# Patient Record
Sex: Female | Born: 1965
Health system: Southern US, Community
[De-identification: ages and names within clinical notes are randomized; demographics above are authoritative.]

## PROBLEM LIST (undated history)

## (undated) DIAGNOSIS — R748 Abnormal levels of other serum enzymes: Secondary | ICD-10-CM

## (undated) DIAGNOSIS — K829 Disease of gallbladder, unspecified: Secondary | ICD-10-CM

## (undated) DIAGNOSIS — K219 Gastro-esophageal reflux disease without esophagitis: Secondary | ICD-10-CM

## (undated) DIAGNOSIS — E785 Hyperlipidemia, unspecified: Secondary | ICD-10-CM

## (undated) DIAGNOSIS — E669 Obesity, unspecified: Secondary | ICD-10-CM

## (undated) DIAGNOSIS — N289 Disorder of kidney and ureter, unspecified: Secondary | ICD-10-CM

## (undated) DIAGNOSIS — K5900 Constipation, unspecified: Secondary | ICD-10-CM

## (undated) DIAGNOSIS — R079 Chest pain, unspecified: Secondary | ICD-10-CM

## (undated) DIAGNOSIS — Z6834 Body mass index (BMI) 34.0-34.9, adult: Secondary | ICD-10-CM

## (undated) DIAGNOSIS — I10 Essential (primary) hypertension: Secondary | ICD-10-CM

## (undated) DIAGNOSIS — R7303 Prediabetes: Secondary | ICD-10-CM

## (undated) DIAGNOSIS — N762 Acute vulvitis: Secondary | ICD-10-CM

## (undated) DIAGNOSIS — T7840XA Allergy, unspecified, initial encounter: Secondary | ICD-10-CM

## (undated) HISTORY — DX: Disorder of kidney and ureter, unspecified: N28.9

## (undated) HISTORY — DX: Obesity, unspecified: E66.9

## (undated) HISTORY — DX: Acute vulvitis: N76.2

## (undated) HISTORY — DX: Abnormal levels of other serum enzymes: R74.8

## (undated) HISTORY — DX: Gastro-esophageal reflux disease without esophagitis: K21.9

## (undated) HISTORY — DX: Hyperlipidemia, unspecified: E78.5

## (undated) HISTORY — DX: Essential (primary) hypertension: I10

## (undated) HISTORY — DX: Chest pain, unspecified: R07.9

## (undated) HISTORY — DX: Body mass index (bmi) 34.0-34.9, adult: Z68.34

## (undated) HISTORY — DX: Constipation, unspecified: K59.00

## (undated) HISTORY — DX: Allergy, unspecified, initial encounter: T78.40XA

## (undated) HISTORY — DX: Prediabetes: R73.03

## (undated) HISTORY — DX: Disease of gallbladder, unspecified: K82.9

---

## 2003-02-26 ENCOUNTER — Encounter: Admission: RE | Admit: 2003-02-26 | Discharge: 2003-02-26 | Payer: Self-pay | Admitting: Family Medicine

## 2011-03-02 ENCOUNTER — Ambulatory Visit: Payer: Self-pay | Admitting: Gastroenterology

## 2012-07-06 ENCOUNTER — Emergency Department (HOSPITAL_COMMUNITY)
Admission: EM | Admit: 2012-07-06 | Discharge: 2012-07-06 | Disposition: A | Discharge status: Home / Self Care | Payer: 59 | Attending: Emergency Medicine | Admitting: Emergency Medicine

## 2012-07-06 ENCOUNTER — Ambulatory Visit (INDEPENDENT_AMBULATORY_CARE_PROVIDER_SITE_OTHER): Payer: Self-pay | Admitting: Emergency Medicine

## 2012-07-06 ENCOUNTER — Emergency Department (HOSPITAL_COMMUNITY): Payer: 59

## 2012-07-06 ENCOUNTER — Encounter (HOSPITAL_COMMUNITY): Payer: Self-pay | Admitting: Emergency Medicine

## 2012-07-06 VITALS — BP 144/86 | HR 81 | Temp 98.7°F | Resp 17 | Ht 60.0 in | Wt 150.0 lb

## 2012-07-06 DIAGNOSIS — R0602 Shortness of breath: Secondary | ICD-10-CM | POA: Insufficient documentation

## 2012-07-06 DIAGNOSIS — R748 Abnormal levels of other serum enzymes: Secondary | ICD-10-CM | POA: Insufficient documentation

## 2012-07-06 DIAGNOSIS — R079 Chest pain, unspecified: Secondary | ICD-10-CM

## 2012-07-06 DIAGNOSIS — R791 Abnormal coagulation profile: Secondary | ICD-10-CM | POA: Insufficient documentation

## 2012-07-06 DIAGNOSIS — I200 Unstable angina: Secondary | ICD-10-CM

## 2012-07-06 DIAGNOSIS — R7989 Other specified abnormal findings of blood chemistry: Secondary | ICD-10-CM

## 2012-07-06 LAB — URINALYSIS, ROUTINE W REFLEX MICROSCOPIC
Bilirubin Urine: NEGATIVE
Ketones, ur: NEGATIVE mg/dL
Leukocytes, UA: NEGATIVE
Nitrite: NEGATIVE
Protein, ur: NEGATIVE mg/dL

## 2012-07-06 LAB — CBC WITH DIFFERENTIAL/PLATELET
Eosinophils Absolute: 0 10*3/uL (ref 0.0–0.7)
Hemoglobin: 11.6 g/dL — ABNORMAL LOW (ref 12.0–15.0)
Lymphocytes Relative: 23 % (ref 12–46)
Lymphs Abs: 1.8 10*3/uL (ref 0.7–4.0)
MCH: 26.8 pg (ref 26.0–34.0)
Monocytes Relative: 11 % (ref 3–12)
Neutro Abs: 5.1 10*3/uL (ref 1.7–7.7)
Neutrophils Relative %: 66 % (ref 43–77)
RBC: 4.33 MIL/uL (ref 3.87–5.11)

## 2012-07-06 LAB — COMPREHENSIVE METABOLIC PANEL
Alkaline Phosphatase: 81 U/L (ref 39–117)
BUN: 9 mg/dL (ref 6–23)
CO2: 24 mEq/L (ref 19–32)
Chloride: 103 mEq/L (ref 96–112)
GFR calc Af Amer: >90 mL/min (ref >90)
GFR calc non Af Amer: >90 mL/min (ref >90)
Glucose, Bld: 86 mg/dL (ref 70–99)
Potassium: 3.8 mEq/L (ref 3.5–5.1)
Total Bilirubin: 0.7 mg/dL (ref 0.3–1.2)

## 2012-07-06 LAB — URINE MICROSCOPIC-ADD ON

## 2012-07-06 LAB — POCT I-STAT TROPONIN I
Troponin i, poc: 0 ng/mL (ref 0.00–0.08)
Troponin i, poc: 0.07 ng/mL (ref 0.00–0.08)

## 2012-07-06 MED ORDER — IOHEXOL 300 MG/ML  SOLN: 50.0000 mL | Freq: Once | INTRAMUSCULAR | Status: DC | PRN

## 2012-07-06 MED ORDER — SODIUM CHLORIDE 0.9 % IV SOLN: 20.0000 mL | INTRAVENOUS | Status: DC

## 2012-07-06 MED ORDER — NITROGLYCERIN 0.3 MG SL SUBL
0.4000 mg | SUBLINGUAL_TABLET | Freq: Once | SUBLINGUAL | Status: AC
  Administered 2012-07-06: 0.3 mg via SUBLINGUAL

## 2012-07-06 MED ORDER — IOHEXOL 350 MG/ML SOLN
100.0000 mL | Freq: Once | INTRAVENOUS | Status: AC | PRN
  Administered 2012-07-06: 100 mL via INTRAVENOUS

## 2012-07-06 NOTE — ED Notes (Signed)
Pt verbalized understanding of importance to follow up with gastro.

## 2012-07-06 NOTE — ED Notes (Signed)
Pt states that she was woken up during the night with chest tightness and some sob, then pain progressed to an "intense pain " lower epigastric region. Pain did not go away , went to Urgent care and was given asprin 325mg  and Nitro x1   Pt was initially rating pain as 10 then after the Nitro about 10 minutes pain went completely away.

## 2012-07-06 NOTE — ED Notes (Signed)
Patient transported to CT 

## 2012-07-06 NOTE — ED Notes (Signed)
Pt.dress in a gown On monitor.

## 2012-07-06 NOTE — Progress Notes (Signed)
[facility] [address], [place] [address] [phone]  Date:  07/06/2012   Name:  Kristy Cook   DOB:  1965-10-08   MRN:  [id number]  PCP:  No primary provider on file.    Chief Complaint: Chest Pain   History of Present Illness:  Kristy Cook is a 47 y.o. very pleasant female patient who presents with the following:  Patient describes a history of intermittent and increasingly more frequent episodes of chest pain that have lasted from an hour previously to today's pain that began around 0300 and was present at 1030.  She denies radiation, has some shortness of breath but no diaphoresis, nausea or vomiting. No other associated complaint.  No sensation of rapid or irregular heart beat.  Non smoker.  No history of hypertension or high cholesterol or diabetes.  No family history of accelerated cardiovascular disease.  Somewhat overweight and sedentary.2  There is no problem list on file for this patient.   No past medical history on file.  No past surgical history on file.  History  Substance Use Topics  . Smoking status: Not on file  . Smokeless tobacco: Not on file  . Alcohol Use: Not on file    No family history on file.  Not on File  Medication list has been reviewed and updated.  No current outpatient prescriptions on file prior to visit.    Review of Systems:  As per HPI, otherwise negative.    Physical Examination: Filed Vitals:   07/06/12 1029  BP: 128/82  Pulse: 95  Temp: 98.7 F (37.1 C)  Resp: 17   Filed Vitals:   07/06/12 1029  Height: 5' (1.524 m)  Weight: 150 lb (68.04 kg)   Body mass index is 29.30 kg/(m^2). Ideal Body Weight: Weight in (lb) to have BMI = 25: 127.7   GEN: WDWN, NAD, Non-toxic, A & O x 3 HEENT: Atraumatic, Normocephalic. Neck supple. No masses, No LAD. Ears and Nose: No external deformity. CV: RRR, No M/G/R. No JVD. No thrill. No extra heart sounds. PULM: CTA B, no wheezes, crackles,  rhonchi. No retractions. No resp. distress. No accessory muscle use. ABD: S, NT, ND, +BS. No rebound. No HSM. EXTR: No c/c/e NEURO Normal gait.  PSYCH: Normally interactive. Conversant. Not depressed or anxious appearing.  Calm demeanor.    Assessment and Plan: Chest pain IV O2 ASA NTG Refer to hospital  ekg significant for possible anterior infarct in past. Remained hemodynamically stable in the office until transfer  Carmelina Dane, MD

## 2012-07-06 NOTE — ED Provider Notes (Signed)
History     CSN: [id number]  Arrival date & time 07/06/12  1148   First MD Initiated Contact with Patient 07/06/12 1214      Chief Complaint  Patient presents with  . Chest Pain    (Consider location/radiation/quality/duration/timing/severity/associated sxs/prior treatment) The history is provided by the patient and medical records.    Niyla Marone is a 47 y.o. female  with no known medical Hx presents to the Emergency Department complaining of acute, persistent, progressively worsening chest pain which woke her from sleep onset 2am.  Pain is described as sharp pain without heaviness, rated at 10/10 for several hours and the lessened close to 7am. Pain was located substernal and did not radiate.  Pt states she was unable to lay down during this time.  She thought is was something that she ate therefore she self-induce vomiting several times without relief.  Pt states laying down increased the pain and made her SOB.  Pt then presented to an [facility] who gave her ASA and nitro. Pt states it took some time for those medications to help, but it did resolved. Associated symptoms include shortness of breath.  Nothing makes it better and laying down makes it worse.  Pt denies fever, chills, headache, abdominal pain, nausea, diarrhea, weakness, dizziness, lightheadedness.   Pt states that palpation also made it worse.  Similar episodes on 2013 and 2012 (total of 3) with spontaneous resolution within an hr or so.  Pain felt the same as previous times, but did not resolve spontaneously.  No personal cardiac Hx. Maternal Grandmother age 8 died of MI and grandfather with MI.     History reviewed. No pertinent past medical history.  Past Surgical History  Procedure Date  . Cesarean section     History reviewed. No pertinent family history.  History  Substance Use Topics  . Smoking status: Never Smoker   . Smokeless tobacco: Not on file  . Alcohol Use: No    OB History    Grav Para Term Preterm  Abortions TAB SAB Ect Mult Living                  Review of Systems  Constitutional: Negative for fever, diaphoresis, appetite change, fatigue and unexpected weight change.  HENT: Negative for mouth sores and neck stiffness.   Eyes: Negative for visual disturbance.  Respiratory: Positive for shortness of breath. Negative for cough, chest tightness and wheezing.   Cardiovascular: Positive for chest pain.  Gastrointestinal: Negative for nausea, vomiting, abdominal pain, diarrhea and constipation.  Genitourinary: Negative for dysuria, urgency, frequency and hematuria.  Skin: Negative for rash.  Neurological: Negative for syncope, light-headedness and headaches.  Psychiatric/Behavioral: Negative for sleep disturbance. The patient is not nervous/anxious.   All other systems reviewed and are negative.    Allergies  Review of patient's allergies indicates no known allergies.  Home Medications  No current outpatient prescriptions on file.  BP 148/85  Pulse 102  Temp 98.2 F (36.8 C) (Oral)  Resp 16  SpO2 100%  LMP 06/25/2012  Physical Exam  Nursing note and vitals reviewed. Constitutional: She is oriented to person, place, and time. She appears well-developed and well-nourished. No distress.  HENT:  Head: Normocephalic and atraumatic.  Mouth/Throat: Oropharynx is clear and moist. No oropharyngeal exudate.  Eyes: Conjunctivae normal are normal. Pupils are equal, round, and reactive to light. No scleral icterus.  Neck: Normal range of motion. Neck supple.  Cardiovascular: Normal rate, regular rhythm, S1 normal, normal heart sounds  and intact distal pulses.  Exam reveals no gallop and no friction rub.   No murmur heard. Pulses:      Radial pulses are 2+ on the right side, and 2+ on the left side.       Dorsalis pedis pulses are 2+ on the right side, and 2+ on the left side.       Posterior tibial pulses are 2+ on the right side, and 2+ on the left side.  Pulmonary/Chest: Effort  normal and breath sounds normal. No respiratory distress. She has no decreased breath sounds. She has no wheezes. She has no rhonchi. She has no rales. She exhibits no tenderness.  Abdominal: Soft. Normal appearance and bowel sounds are normal. She exhibits no distension and no mass. There is no tenderness. There is no rigidity, no rebound, no guarding, no CVA tenderness, no tenderness at McBurney's point and negative Murphy's sign.  Musculoskeletal: Normal range of motion. She exhibits no edema and no tenderness.       No calf tenderness, no unilateral leg swelling, no peripheral edema  Lymphadenopathy:    She has no cervical adenopathy.  Neurological: She is alert and oriented to person, place, and time. She exhibits normal muscle tone. Coordination normal.       Speech is clear and goal oriented Moves extremities without ataxia  Skin: Skin is warm and dry. No rash noted. She is not diaphoretic. No erythema.  Psychiatric: She has a normal mood and affect.    ED Course  Procedures (including critical care time)  Labs Reviewed  CBC WITH DIFFERENTIAL - Abnormal; Notable for the following:    Hemoglobin 11.6 (*)     HCT 34.7 (*)     All other components within normal limits  COMPREHENSIVE METABOLIC PANEL - Abnormal; Notable for the following:    Albumin 3.1 (*)     AST 1447 (*)     ALT 1257 (*)     All other components within normal limits  URINALYSIS, ROUTINE W REFLEX MICROSCOPIC - Abnormal; Notable for the following:    Color, Urine AMBER (*)  BIOCHEMICALS MAY BE AFFECTED BY COLOR   Hgb urine dipstick SMALL (*)     All other components within normal limits  D-DIMER, QUANTITATIVE - Abnormal; Notable for the following:    D-Dimer, Quant 1.42 (*)     All other components within normal limits  URINE MICROSCOPIC-ADD ON - Abnormal; Notable for the following:    Squamous Epithelial / LPF FEW (*)     Bacteria, UA FEW (*)     All other components within normal limits  PREGNANCY, URINE    POCT I-STAT TROPONIN I  LIPASE, BLOOD  POCT I-STAT TROPONIN I  CBC   Dg Chest 2 View  07/06/2012  *RADIOLOGY REPORT*  Clinical Data: Chest pain, shortness of breath  CHEST - 2 VIEW  Comparison: None.  Findings: Normal heart size and vascularity.  Minimal streaky opacity in the left lower lobe retrocardiac region, suspect atelectasis.  No definite edema pattern, CHF, focal pneumonia, collapse, consolidation, effusion or pneumothorax.  Trachea is midline. degenerative changes of the spine.  IMPRESSION: Streaky left base opacity, suspect atelectasis.   Original Report Authenticated By: Judie Petit. Miles Costain, M.D.     Results for orders placed during the hospital encounter of 07/06/12  CBC WITH DIFFERENTIAL      Component Value Range   WBC 7.8  4.0 - 10.5 K/uL   RBC 4.33  3.87 - 5.11 MIL/uL   Hemoglobin  11.6 (*) 12.0 - 15.0 g/dL   HCT 40.9 (*) 81.1 - 91.4 %   MCV 80.1  78.0 - 100.0 fL   MCH 26.8  26.0 - 34.0 pg   MCHC 33.4  30.0 - 36.0 g/dL   RDW 78.2  95.6 - 21.3 %   Platelets 252  150 - 400 K/uL   Neutrophils Relative 66  43 - 77 %   Neutro Abs 5.1  1.7 - 7.7 K/uL   Lymphocytes Relative 23  12 - 46 %   Lymphs Abs 1.8  0.7 - 4.0 K/uL   Monocytes Relative 11  3 - 12 %   Monocytes Absolute 0.9  0.1 - 1.0 K/uL   Eosinophils Relative 0  0 - 5 %   Eosinophils Absolute 0.0  0.0 - 0.7 K/uL   Basophils Relative 0  0 - 1 %   Basophils Absolute 0.0  0.0 - 0.1 K/uL  COMPREHENSIVE METABOLIC PANEL      Component Value Range   Sodium 137  135 - 145 mEq/L   Potassium 3.8  3.5 - 5.1 mEq/L   Chloride 103  96 - 112 mEq/L   CO2 24  19 - 32 mEq/L   Glucose, Bld 86  70 - 99 mg/dL   BUN 9  6 - 23 mg/dL   Creatinine, Ser 0.86  0.50 - 1.10 mg/dL   Calcium 8.7  8.4 - 57.8 mg/dL   Total Protein 6.7  6.0 - 8.3 g/dL   Albumin 3.1 (*) 3.5 - 5.2 g/dL   AST 4696 (*) 0 - 37 U/L   ALT 1257 (*) 0 - 35 U/L   Alkaline Phosphatase 81  39 - 117 U/L   Total Bilirubin 0.7  0.3 - 1.2 mg/dL   GFR calc non Af Amer >90  >90  mL/min   GFR calc Af Amer >90  >90 mL/min  URINALYSIS, ROUTINE W REFLEX MICROSCOPIC      Component Value Range   Color, Urine AMBER (*) YELLOW   APPearance CLEAR  CLEAR   Specific Gravity, Urine 1.022  1.005 - 1.030   pH 5.5  5.0 - 8.0   Glucose, UA NEGATIVE  NEGATIVE mg/dL   Hgb urine dipstick SMALL (*) NEGATIVE   Bilirubin Urine NEGATIVE  NEGATIVE   Ketones, ur NEGATIVE  NEGATIVE mg/dL   Protein, ur NEGATIVE  NEGATIVE mg/dL   Urobilinogen, UA 1.0  0.0 - 1.0 mg/dL   Nitrite NEGATIVE  NEGATIVE   Leukocytes, UA NEGATIVE  NEGATIVE  PREGNANCY, URINE      Component Value Range   Preg Test, Ur NEGATIVE  NEGATIVE  D-DIMER, QUANTITATIVE      Component Value Range   D-Dimer, Quant 1.42 (*) 0.00 - 0.48 ug/mL-FEU  URINE MICROSCOPIC-ADD ON      Component Value Range   Squamous Epithelial / LPF FEW (*) RARE   WBC, UA 0-2  <3 WBC/hpf   RBC / HPF 0-2  <3 RBC/hpf   Bacteria, UA FEW (*) RARE  POCT I-STAT TROPONIN I      Component Value Range   Troponin i, poc 0.00  0.00 - 0.08 ng/mL   Comment 3           LIPASE, BLOOD      Component Value Range   Lipase 26  11 - 59 U/L  POCT I-STAT TROPONIN I      Component Value Range   Troponin i, poc 0.07  0.00 - 0.08 ng/mL  Comment 3            Dg Chest 2 View  07/06/2012  *RADIOLOGY REPORT*  Clinical Data: Chest pain, shortness of breath  CHEST - 2 VIEW  Comparison: None.  Findings: Normal heart size and vascularity.  Minimal streaky opacity in the left lower lobe retrocardiac region, suspect atelectasis.  No definite edema pattern, CHF, focal pneumonia, collapse, consolidation, effusion or pneumothorax.  Trachea is midline. degenerative changes of the spine.  IMPRESSION: Streaky left base opacity, suspect atelectasis.   Original Report Authenticated By: Judie Petit. Miles Costain, M.D.     ECG;  Date: 07/06/2012  Rate: 81  Rhythm: normal sinus rhythm  QRS Axis: normal  Intervals: normal  ST/T Wave abnormalities: normal  Conduction Disutrbances:none   Narrative Interpretation: nonischemic ECG  Old EKG Reviewed: none available    1. Chest pain   2. Elevated liver enzymes   3. Elevated d-dimer       MDM  Alinda Sierras with chest pain.  Concern for ACS vs PE vs epigastric etiologies.  Patient is PERC negative and a TIMI score 0. Patient without exogenous estrogen, recent surgery, long travel or immobilization.  She's not tachycardic currently short of breath. She is pain free on my assessment, NAD, alert and oriented, nonseptic, nontoxic appearing.    Patient with an unremarkable CBC, urinalysis without evidence of urinary tract infection, d-dimer positive at 1.42 the. CMP with elevated liver enzymes with AST at 1447 and ALT at 1257.  Initial troponin of 0.00.  Patient without history of hepatitis, pancreatitis or gallstones.    Discussed the patient with Dr. Clarene Duke; we'll proceed with CT angiography and will skin daily at the same time to look for pancreatitis, choledocholithiasis, cholangitis.    Pt asking to leave AMA.  I explained our concerns, the risk of leaving, and insurance implications.  She states understanding and consents to the CT angio.  Pt with repeat troponin at 0.07, elevated from the initial of 0.00.    Dr. Samuel Jester was consulted, evaluated this patient with me and agrees with the plan.    I have discussed the patient and the plan with Santiago Glad PA-C and she will assume care including follow-up of the Ct abd and Ct angio.        Dahlia Client Rogue Pautler, PA-C 07/06/12 1648

## 2012-07-06 NOTE — ED Notes (Signed)
CT notified pt finished drinking contrast  

## 2012-07-07 ENCOUNTER — Telehealth: Payer: Self-pay

## 2012-07-07 NOTE — Telephone Encounter (Signed)
Records printed and in drawer to be picked up. LMOM.

## 2012-07-07 NOTE — Telephone Encounter (Signed)
PT STATES SHE WOULD LIKE TO COME BY AND P/U A COPY OF HER OV NOTES. PLEASE CALL [phone] WHEN READY FOR HER TO PICK UP

## 2012-07-07 NOTE — ED Provider Notes (Signed)
Medical screening examination/treatment/procedure(s) were performed by non-physician practitioner and as supervising physician I was immediately available for consultation/collaboration.   Abdulkareem Badolato M Kerby Borner, DO 07/07/12 1238 

## 2012-07-07 NOTE — Progress Notes (Signed)
Reviewed and agree.

## 2012-07-09 ENCOUNTER — Ambulatory Visit
Admission: RE | Admit: 2012-07-09 | Discharge: 2012-07-09 | Disposition: A | Discharge status: Home / Self Care | Payer: 59 | Source: Ambulatory Visit | Attending: Gastroenterology | Admitting: Gastroenterology

## 2012-07-09 ENCOUNTER — Other Ambulatory Visit: Payer: Self-pay | Admitting: Gastroenterology

## 2012-07-09 DIAGNOSIS — R7401 Elevation of levels of liver transaminase levels: Secondary | ICD-10-CM

## 2012-07-30 ENCOUNTER — Other Ambulatory Visit: Payer: Self-pay | Admitting: Obstetrics and Gynecology

## 2012-10-16 ENCOUNTER — Encounter: Payer: Self-pay | Admitting: Emergency Medicine

## 2013-08-04 ENCOUNTER — Other Ambulatory Visit: Payer: Self-pay | Admitting: Obstetrics and Gynecology

## 2014-06-14 ENCOUNTER — Ambulatory Visit (INDEPENDENT_AMBULATORY_CARE_PROVIDER_SITE_OTHER): Payer: 59 | Admitting: Family Medicine

## 2014-06-14 VITALS — BP 122/82 | HR 83 | Temp 98.3°F | Resp 20 | Ht 60.0 in | Wt 172.8 lb

## 2014-06-14 DIAGNOSIS — R05 Cough: Secondary | ICD-10-CM

## 2014-06-14 DIAGNOSIS — R0602 Shortness of breath: Secondary | ICD-10-CM

## 2014-06-14 MED ORDER — ALBUTEROL SULFATE (2.5 MG/3ML) 0.083% IN NEBU: 2.5000 mg | INHALATION_SOLUTION | Freq: Once | RESPIRATORY_TRACT | Status: DC

## 2014-06-14 NOTE — Progress Notes (Signed)
[facility] [address] [place] [address] [phone]  Date:  06/14/2014   Name:  Kristy Cook   DOB:  [birth year]   MRN:  [id number]  PCP:  No primary care provider on file.    Chief Complaint: Cough; Shortness of Breath; and Chest Pain   History of Present Illness:  Kristy Cook is a 48 y.o. very pleasant female patient who presents with the following:  She is here today with SOB- she noted it first around thanksgiving with cough, SOB, painful cough.  Lasted about 2 weeks, went away, but sx returned yesterday.   She is coughing again and also feels SOB She has not noted a fever, but she is couging up some mucus.  No ST or earache No GI symptoms.   She denies CP, but does have SOB.  It was "really bad" last night, not as bad today She is not aware of any lung problems.  No cardiovascular problems  She was in the ED last year for chest pain but this turned out to be gallstones.  She has not had surgery and has not had any more issues with this.   No recent travel or surgery, no history of DVT, on depo, no calf pain or swelling  She has a history of allegies but no asthma Never a smoker.   Patient Active Problem List   Diagnosis Date Noted  . Unstable angina 07/06/2012    Past Medical History  Diagnosis Date  . Allergy     Past Surgical History  Procedure Laterality Date  . Cesarean section      History  Substance Use Topics  . Smoking status: Never Smoker   . Smokeless tobacco: Not on file  . Alcohol Use: No    History reviewed. No pertinent family history.  No Known Allergies  Medication list has been reviewed and updated.  No current outpatient prescriptions on file prior to visit.   No current facility-administered medications on file prior to visit.    Review of Systems:  As per HPI- otherwise negative.   Physical Examination: Filed Vitals:   06/14/14 1017  BP: 122/82  Pulse: 83  Temp: 98.3 F (36.8 C)   Resp: 20   Filed Vitals:   06/14/14 1017  Height: 5' (1.524 m)  Weight: 172 lb 12.8 oz (78.382 kg)   Body mass index is 33.75 kg/(m^2). Ideal Body Weight: Weight in (lb) to have BMI = 25: 127.7  GEN: WDWN, NAD, Non-toxic, A & O x 3, obese, looks well HEENT: Atraumatic, Normocephalic. Neck supple. No masses, No LAD.  Bilateral TM wnl, oropharynx normal.  PEERL,EOMI.   Ears and Nose: No external deformity. CV: RRR, No M/G/R. No JVD. No thrill. No extra heart sounds. PULM: CTA B, no wheezes, crackles, rhonchi. No retractions. No resp. distress. No accessory muscle use. ABD: S, NT, ND EXTR: No c/c/e NEURO Normal gait.  PSYCH: Normally interactive. Conversant. Not depressed or anxious appearing.  Calm demeanor.   Albuterol neb: this did not help She declines CXR, EKG, or labs (d dimer).    Assessment and Plan: SOB (shortness of breath) - Plan: albuterol (PROVENTIL) (2.5 MG/3ML) 0.083% nebulizer solution 2.5 mg  Here today with C/O SOB.  Discussed possibility of PE, pneumonia, cardiac problems. However she declines any further testing as above. At this time she prefers watchful waiting.  She does agree to seek care if she is getting worse or if her sx persist  Signed  Lamar Blinks, MD

## 2014-06-14 NOTE — Patient Instructions (Signed)
Let me know if you do not feel better in the next couple of days- if you are getting worse seek care right away!

## 2014-08-12 ENCOUNTER — Other Ambulatory Visit: Payer: Self-pay | Admitting: Obstetrics and Gynecology

## 2014-08-13 LAB — CYTOLOGY - PAP

## 2014-09-18 ENCOUNTER — Encounter (HOSPITAL_COMMUNITY): Payer: Self-pay

## 2014-09-18 ENCOUNTER — Emergency Department (HOSPITAL_COMMUNITY)
Admission: EM | Admit: 2014-09-18 | Discharge: 2014-09-18 | Discharge status: Against Medical Advice | Payer: 59 | Attending: Emergency Medicine | Admitting: Emergency Medicine

## 2014-09-18 ENCOUNTER — Encounter (HOSPITAL_COMMUNITY): Payer: Self-pay | Admitting: Emergency Medicine

## 2014-09-18 ENCOUNTER — Emergency Department (HOSPITAL_COMMUNITY): Payer: 59

## 2014-09-18 ENCOUNTER — Emergency Department (HOSPITAL_COMMUNITY)
Admission: EM | Admit: 2014-09-18 | Discharge: 2014-09-18 | Disposition: A | Discharge status: Home / Self Care | Payer: 59 | Attending: Emergency Medicine | Admitting: Emergency Medicine

## 2014-09-18 DIAGNOSIS — R079 Chest pain, unspecified: Secondary | ICD-10-CM | POA: Diagnosis not present

## 2014-09-18 DIAGNOSIS — R200 Anesthesia of skin: Secondary | ICD-10-CM | POA: Diagnosis not present

## 2014-09-18 DIAGNOSIS — R1011 Right upper quadrant pain: Secondary | ICD-10-CM

## 2014-09-18 DIAGNOSIS — K8020 Calculus of gallbladder without cholecystitis without obstruction: Secondary | ICD-10-CM

## 2014-09-18 DIAGNOSIS — R1013 Epigastric pain: Secondary | ICD-10-CM | POA: Diagnosis present

## 2014-09-18 LAB — I-STAT TROPONIN, ED
TROPONIN I, POC: 0 ng/mL (ref 0.00–0.08)
Troponin i, poc: 0 ng/mL (ref 0.00–0.08)

## 2014-09-18 LAB — HEPATIC FUNCTION PANEL
ALBUMIN: 4 g/dL (ref 3.5–5.2)
ALK PHOS: 79 U/L (ref 39–117)
ALT: 358 U/L — ABNORMAL HIGH (ref 0–35)
AST: 526 U/L — AB (ref 0–37)
BILIRUBIN INDIRECT: 0.9 mg/dL (ref 0.3–0.9)
Bilirubin, Direct: 0.5 mg/dL (ref 0.0–0.5)
Total Bilirubin: 1.4 mg/dL — ABNORMAL HIGH (ref 0.3–1.2)
Total Protein: 8.3 g/dL (ref 6.0–8.3)

## 2014-09-18 LAB — CBC
HCT: 42.2 % (ref 36.0–46.0)
Hemoglobin: 14.1 g/dL (ref 12.0–15.0)
MCH: 27.4 pg (ref 26.0–34.0)
MCHC: 33.4 g/dL (ref 30.0–36.0)
MCV: 81.9 fL (ref 78.0–100.0)
Platelets: 306 10*3/uL (ref 150–400)
RBC: 5.15 MIL/uL — AB (ref 3.87–5.11)
RDW: 13.4 % (ref 11.5–15.5)
WBC: 15.2 10*3/uL — AB (ref 4.0–10.5)

## 2014-09-18 LAB — BASIC METABOLIC PANEL
Anion gap: 13 (ref 5–15)
BUN: 12 mg/dL (ref 6–23)
CO2: 26 mmol/L (ref 19–32)
Calcium: 10.1 mg/dL (ref 8.4–10.5)
Chloride: 100 mmol/L (ref 96–112)
Creatinine, Ser: 0.77 mg/dL (ref 0.50–1.10)
GFR calc non Af Amer: >90 mL/min (ref >90)
GLUCOSE: 121 mg/dL — AB (ref 70–99)
Potassium: 3.5 mmol/L (ref 3.5–5.1)
Sodium: 139 mmol/L (ref 135–145)

## 2014-09-18 LAB — LIPASE, BLOOD: LIPASE: 26 U/L (ref 11–59)

## 2014-09-18 MED ORDER — ONDANSETRON 4 MG PO TBDP: ORAL_TABLET | ORAL | Status: DC

## 2014-09-18 MED ORDER — OXYCODONE-ACETAMINOPHEN 5-325 MG PO TABS: 1.0000 | ORAL_TABLET | Freq: Four times a day (QID) | ORAL | Status: DC | PRN

## 2014-09-18 MED ORDER — ONDANSETRON HCL 4 MG/2ML IJ SOLN
4.0000 mg | Freq: Once | INTRAMUSCULAR | Status: AC
  Administered 2014-09-18: 4 mg via INTRAVENOUS
  Filled 2014-09-18: qty 2

## 2014-09-18 MED ORDER — HYDROMORPHONE HCL 1 MG/ML IJ SOLN
1.0000 mg | Freq: Once | INTRAMUSCULAR | Status: AC
  Administered 2014-09-18: 1 mg via INTRAVENOUS
  Filled 2014-09-18: qty 1

## 2014-09-18 NOTE — ED Notes (Signed)
Pt a t [employer] lol[facility] ed  Did not tell staff she was leaving

## 2014-09-18 NOTE — ED Notes (Addendum)
Pt c/o center chest pain that radiates to right side of chest also some numbness to B/L fingers. Pt had similar symptoms in past and was told it was her gallbladder.

## 2014-09-18 NOTE — ED Notes (Addendum)
US at bedside

## 2014-09-18 NOTE — ED Notes (Signed)
Bed: [facility] Expected date:  Expected time:  Means of arrival:  Comments: Pt in room

## 2014-09-18 NOTE — ED Notes (Signed)
Pt reports she is feeling like she felt back in 2014 when she had gall bladder issues.  Pt reports stabbing pain that comes in waves under Rt breast and rad to back.  Pt report pain is 7/10 at present.

## 2014-09-18 NOTE — Discharge Instructions (Signed)
Biliary Colic  °Biliary colic is a steady or irregular pain in the upper abdomen. It is usually under the right side of the rib cage. It happens when gallstones interfere with the normal flow of bile from the gallbladder. Bile is a liquid that helps to digest fats. Bile is made in the liver and stored in the gallbladder. When you eat a meal, bile passes from the gallbladder through the cystic duct and the common bile duct into the small intestine. There, it mixes with partially digested food. If a gallstone blocks either of these ducts, the normal flow of bile is blocked. The muscle cells in the bile duct contract forcefully to try to move the stone. This causes the pain of biliary colic.  °SYMPTOMS  °· A person with biliary colic usually complains of pain in the upper abdomen. This pain can be: °¨ In the center of the upper abdomen just below the breastbone. °¨ In the upper-right part of the abdomen, near the gallbladder and liver. °¨ Spread back toward the right shoulder blade. °· Nausea and vomiting. °· The pain usually occurs after eating. °· Biliary colic is usually triggered by the digestive system's demand for bile. The demand for bile is high after fatty meals. Symptoms can also occur when a person who has been fasting suddenly eats a very large meal. Most episodes of biliary colic pass after 1 to 5 hours. After the most intense pain passes, your abdomen may continue to ache mildly for about 24 hours. °DIAGNOSIS  °After you describe your symptoms, your caregiver will perform a physical exam. He or she will pay attention to the upper right portion of your belly (abdomen). This is the area of your liver and gallbladder. An ultrasound will help your caregiver look for gallstones. Specialized scans of the gallbladder may also be done. Blood tests may be done, especially if you have fever or if your pain persists. °PREVENTION  °Biliary colic can be prevented by controlling the risk factors for gallstones. Some of  these risk factors, such as heredity, increasing age, and pregnancy are a normal part of life. Obesity and a high-fat diet are risk factors you can change through a healthy lifestyle. Women going through menopause who take hormone replacement therapy (estrogen) are also more likely to develop biliary colic. °TREATMENT  °· Pain medication may be prescribed. °· You may be encouraged to eat a fat-free diet. °· If the first episode of biliary colic is severe, or episodes of colic keep retuning, surgery to remove the gallbladder (cholecystectomy) is usually recommended. This procedure can be done through small incisions using an instrument called a laparoscope. The procedure often requires a brief stay in the hospital. Some people can leave the hospital the same day. It is the most widely used treatment in people troubled by painful gallstones. It is effective and safe, with no complications in more than 90% of cases. °· If surgery cannot be done, medication that dissolves gallstones may be used. This medication is expensive and can take months or years to work. Only small stones will dissolve. °· Rarely, medication to dissolve gallstones is combined with a procedure called shock-wave lithotripsy. This procedure uses carefully aimed shock waves to break up gallstones. In many people treated with this procedure, gallstones form again within a few years. °PROGNOSIS  °If gallstones block your cystic duct or common bile duct, you are at risk for repeated episodes of biliary colic. There is also a 25% chance that you will develop   a gallbladder infection(acute cholecystitis), or some other complication of gallstones within 10 to 20 years. If you have surgery, schedule it at a time that is convenient for you and at a time when you are not sick. °HOME CARE INSTRUCTIONS  °· Drink plenty of clear fluids. °· Avoid fatty, greasy or fried foods, or any foods that make your pain worse. °· Take medications as directed. °SEEK MEDICAL  CARE IF:  °· You develop a fever over 100.5° F (38.1° C). °· Your pain gets worse over time. °· You develop nausea that prevents you from eating and drinking. °· You develop vomiting. °SEEK IMMEDIATE MEDICAL CARE IF:  °· You have continuous or severe belly (abdominal) pain which is not relieved with medications. °· You develop nausea and vomiting which is not relieved with medications. °· You have symptoms of biliary colic and you suddenly develop a fever and shaking chills. This may signal cholecystitis. Call your caregiver immediately. °· You develop a yellow color to your skin or the white part of your eyes (jaundice). °Document Released: 11/12/2005 Document Revised: 09/03/2011 Document Reviewed: 01/22/2008 °[employer]® Patient Information ©2015 [employer]. This information is not intended to replace advice given to you by your health care provider. Make sure you discuss any questions you have with your health care provider. ° °

## 2014-09-18 NOTE — ED Notes (Signed)
Pt presents with c/o chest pain around her sternum area that radiates to her back. Pt reports the pain is sharp in nature. Pt was just at [facility] and left because of the wait. Pt reports she has had this pain before and was diagnosed with gallstones. Pt is moaning and groaning in the triage chair.

## 2014-09-18 NOTE — ED Provider Notes (Signed)
CSN: [id number]     Arrival date & time 09/18/14  1846 History   First MD Initiated Contact with Patient 09/18/14 1936     Chief Complaint  Patient presents with  . Chest Pain     (Consider location/radiation/quality/duration/timing/severity/associated sxs/prior Treatment) Patient is a 49 y.o. female presenting with chest pain. The history is provided by the patient.  Chest Pain Pain location:  Epigastric Pain quality: sharp   Radiates to: R back. Pain radiates to the back: yes   Pain severity:  Moderate Onset quality:  Sudden Duration:  6 hours Timing:  Constant Chronicity:  Recurrent Context: at rest   Relieved by:  Nothing Worsened by:  Nothing tried Associated symptoms: abdominal pain   Associated symptoms: no cough, no fever, no nausea, no shortness of breath and not vomiting     Past Medical History  Diagnosis Date  . Allergy    Past Surgical History  Procedure Laterality Date  . Cesarean section     No family history on file. History  Substance Use Topics  . Smoking status: Never Smoker   . Smokeless tobacco: Not on file  . Alcohol Use: No   OB History    No data available     Review of Systems  Constitutional: Negative for fever.  Respiratory: Negative for cough and shortness of breath.   Cardiovascular: Positive for chest pain.  Gastrointestinal: Positive for abdominal pain. Negative for nausea and vomiting.  All other systems reviewed and are negative.     Allergies  Peanut-containing drug products  Home Medications   Prior to Admission medications   Medication Sig Start Date End Date Taking? Authorizing Provider  CHERATUSSIN AC 100-10 MG/5ML syrup Take 10 mLs by mouth as needed for cough or congestion.  09/10/14  Yes Historical Provider, MD  medroxyPROGESTERone (DEPO-PROVERA) 150 MG/ML injection Inject 150 mg into the muscle every 3 (three) months.   Yes Historical Provider, MD  VENTOLIN HFA 108 (90 BASE) MCG/ACT inhaler Inhale 2 puffs into  the lungs every 4 (four) hours as needed. 08/09/14  Yes Historical Provider, MD   BP 150/91 mmHg  Pulse 86  Temp(Src) 97.8 F (36.6 C) (Oral)  Resp 24  SpO2 99% Physical Exam  Constitutional: She is oriented to person, place, and time. She appears well-developed and well-nourished. No distress.  HENT:  Head: Normocephalic and atraumatic.  Mouth/Throat: Oropharynx is clear and moist.  Eyes: EOM are normal. Pupils are equal, round, and reactive to light.  Neck: Normal range of motion. Neck supple.  Cardiovascular: Normal rate and regular rhythm.  Exam reveals no friction rub.   No murmur heard. Pulmonary/Chest: Effort normal and breath sounds normal. No respiratory distress. She has no wheezes. She has no rales.  Abdominal: Soft. She exhibits no distension. There is tenderness (epigastric, LUQ, RUQ). There is no rebound.  Musculoskeletal: Normal range of motion. She exhibits no edema.  Neurological: She is alert and oriented to person, place, and time. No cranial nerve deficit. She exhibits normal muscle tone. Coordination normal.  Skin: No rash noted. She is not diaphoretic.  Nursing note and vitals reviewed.   ED Course  Procedures (including critical care time) Labs Review Labs Reviewed  CBC  COMPREHENSIVE METABOLIC PANEL  LIPASE, BLOOD  I-STAT [place], ED    Imaging Review Dg Chest 2 View  09/18/2014   CLINICAL DATA:  Chest pain her on the sternal area radiating to the pack.  EXAM: CHEST  2 VIEW  COMPARISON:  07/06/2012  FINDINGS: Multiple monitoring leads overlie the patient. Stable cardiac and mediastinal contours. Minimal bilateral lower lung linear opacities. No large consolidative pulmonary opacity. No pleural effusion or pneumothorax. Mid thoracic spine degenerative changes.  IMPRESSION: Minimal dependent atelectasis bilateral lung bases.   Electronically Signed   By: Lovey Newcomer M.D.   On: 09/18/2014 20:56   US Abdomen Limited Ruq  09/18/2014   CLINICAL DATA:  Right  upper quadrant abdominal pain.  EXAM: US ABDOMEN LIMITED - RIGHT UPPER QUADRANT  COMPARISON:  Ultrasound 07/09/2012  FINDINGS: Gallbladder:  Physiologically distended. Multiple layering shadowing stones and minimal sludge. No gallbladder wall thickening, wall thickness of 2.8 mm. No sonographic Murphy sign noted.  Common bile duct:  Diameter: 2.7 mm, normal  Liver:  No focal lesion identified. Within normal limits in parenchymal echogenicity.  IMPRESSION: Cholelithiasis.  No cholecystitis or biliary dilatation.   Electronically Signed   By: Jeb Levering M.D.   On: 09/18/2014 22:09     EKG Interpretation   Date/Time:  Saturday September 18 2014 18:48:32 EDT Ventricular Rate:  81 PR Interval:  159 QRS Duration: 79 QT Interval:  362 QTC Calculation: 420 R Axis:   70 Text Interpretation:  Sinus rhythm Low voltage, precordial leads Probable  anteroseptal infarct, old Baseline wander in lead(s) V3 V4 V5 V6 No  significant change since last tracing Confirmed by Mingo Amber  MD, D[place]  ([id number]) on 09/18/2014 7:37:25 PM      MDM   Final diagnoses:  RUQ pain  Calculus of gallbladder without cholecystitis without obstruction    37F here with epigastric pain, concerning for biliary disease. Patient states feels similar to prior gallbladder attacks. Has epigastric pain that radiates around to her R flank. No nausea/vomiting. AFVSS here. Diffuse upper abdominal pain on exam. EKG similar to prior. Will [place] for possible GB problem. [place] with cholelithiasis, negative for cholecystitis. Feeling better after Dilaudid, given [facility] f/u info. Does have some elevation in her transaminases and a mild white count - mild elevation in bilirubin but not overly so, only 1.4. No concern for cholecystitis with normal [place], could have mild hepatitis. Patient stable for discharge.  Evelina Bucy, MD 09/18/14 2308

## 2014-09-18 NOTE — ED Notes (Signed)
Pt does not wish to have labs drawn as it was done tonight at [facility].   RN notified Waiting on provider's word if labs are needed.

## 2015-08-31 ENCOUNTER — Other Ambulatory Visit: Payer: Self-pay | Admitting: Obstetrics and Gynecology

## 2015-09-01 LAB — CYTOLOGY - PAP

## 2016-07-05 DIAGNOSIS — L280 Lichen simplex chronicus: Secondary | ICD-10-CM | POA: Diagnosis not present

## 2016-08-14 DIAGNOSIS — L280 Lichen simplex chronicus: Secondary | ICD-10-CM | POA: Diagnosis not present

## 2016-09-06 ENCOUNTER — Other Ambulatory Visit: Payer: Self-pay | Admitting: Obstetrics and Gynecology

## 2016-09-06 DIAGNOSIS — Z124 Encounter for screening for malignant neoplasm of cervix: Secondary | ICD-10-CM | POA: Diagnosis not present

## 2016-09-06 DIAGNOSIS — Z01419 Encounter for gynecological examination (general) (routine) without abnormal findings: Secondary | ICD-10-CM | POA: Diagnosis not present

## 2016-09-06 DIAGNOSIS — Z1231 Encounter for screening mammogram for malignant neoplasm of breast: Secondary | ICD-10-CM | POA: Diagnosis not present

## 2016-09-10 LAB — CYTOLOGY - PAP

## 2016-09-13 ENCOUNTER — Encounter (INDEPENDENT_AMBULATORY_CARE_PROVIDER_SITE_OTHER): Payer: Self-pay | Admitting: Family Medicine

## 2016-09-25 ENCOUNTER — Ambulatory Visit (INDEPENDENT_AMBULATORY_CARE_PROVIDER_SITE_OTHER): Payer: 59 | Admitting: Family Medicine

## 2016-09-25 ENCOUNTER — Encounter (INDEPENDENT_AMBULATORY_CARE_PROVIDER_SITE_OTHER): Payer: Self-pay | Admitting: Family Medicine

## 2016-09-25 VITALS — BP 138/85 | HR 95 | Temp 98.7°F | Resp 14 | Ht 60.0 in | Wt 177.0 lb

## 2016-09-25 DIAGNOSIS — R5383 Other fatigue: Secondary | ICD-10-CM | POA: Diagnosis not present

## 2016-09-25 DIAGNOSIS — R7989 Other specified abnormal findings of blood chemistry: Secondary | ICD-10-CM | POA: Diagnosis not present

## 2016-09-25 DIAGNOSIS — Z9189 Other specified personal risk factors, not elsewhere classified: Secondary | ICD-10-CM | POA: Diagnosis not present

## 2016-09-25 DIAGNOSIS — E669 Obesity, unspecified: Secondary | ICD-10-CM

## 2016-09-25 DIAGNOSIS — Z1389 Encounter for screening for other disorder: Secondary | ICD-10-CM | POA: Diagnosis not present

## 2016-09-25 DIAGNOSIS — R0602 Shortness of breath: Secondary | ICD-10-CM

## 2016-09-25 DIAGNOSIS — R945 Abnormal results of liver function studies: Secondary | ICD-10-CM

## 2016-09-25 DIAGNOSIS — Z6834 Body mass index (BMI) 34.0-34.9, adult: Secondary | ICD-10-CM

## 2016-09-25 DIAGNOSIS — Z0289 Encounter for other administrative examinations: Secondary | ICD-10-CM

## 2016-09-25 DIAGNOSIS — Z1331 Encounter for screening for depression: Secondary | ICD-10-CM

## 2016-09-25 HISTORY — DX: Obesity, unspecified: E66.9

## 2016-09-25 NOTE — Progress Notes (Signed)
Office: [phone]  /  Fax: [phone]   HPI:   Chief Complaint: OBESITY  Kristy Cook (MR# [id number]) is a 51 y.o. female who presents on 09/25/2016 for obesity evaluation and treatment. Current BMI is Body mass index is 34.57 kg/m.Kristy Cook has struggled with obesity for years and has been unsuccessful in either losing weight or maintaining long term weight loss. [name] attended our information session and states she is currently in the action stage of change and ready to dedicate time achieving and maintaining a healthier weight.  [name] states her family eats meals together her desired weight loss is 33 lbs she has been heavy most of  her life she started gaining weight early 37's her heaviest weight ever was 183 lbs. she is a picky eater and doesn't like to eat healthier foods  she has significant food cravings issues  she snacks frequently in the evenings she is frequently drinking liquids with calories she frequently eats larger portions than normal  she has binge eating behaviors she struggles with emotional eating    Fatigue Kristy Cook feels her energy is lower than it should be. This has worsened with weight gain and has not worsened recently. Kristy Cook admits to daytime somnolence and  denies waking up still tired. Patient is at risk for obstructive sleep apnea. Patent has a history of symptoms of daytime fatigue. Patient generally gets 6 or 7 hours of sleep per night, and states they generally have generally restful sleep. Snoring is present. Apneic episodes are not present. Epworth Sleepiness Score is 4.  Dyspnea on exertion Kristy Cook notes increasing shortness of breath with exercising and seems to be worsening over time with weight gain. She notes getting out of breath sooner with activity than she used to. This has not gotten worse recently. [name] denies orthopnea.  Elevated Liver Function Test Kristy Cook has a new dx of elevated ALT. Kristy Cook has had some significantly  elevated LFT in the last 5 years, at least 2 times. Her BMI is over 40. She denies abdominal pain or jaundice and has never been told of any liver problems in the past. She denies drinking alcohol. She denies any work up for these labs.  Kristy Cook does have a history of cholelithiasis and still has her gallbladder.  At risk for cardiovascular disease Kristy Cook is at a higher than average risk for cardiovascular disease due to obesity and positive family history of coronary artery disease and MI. She currently denies any chest pain. [name] has borderline elevated blood pressure today.  Depression Screen Kristy Cook's Food and Mood (modified PHQ-9) score was  Depression screen PHQ 2/9 09/25/2016  Decreased Interest 1  Down, Depressed, Hopeless 0  PHQ - 2 Score 1  Altered sleeping 1  Tired, decreased energy 1  Change in appetite 1  Feeling bad or failure about yourself  0  Trouble concentrating 0  Moving slowly or fidgety/restless 0  Suicidal thoughts 0  PHQ-9 Score 4    ALLERGIES: Allergies  Allergen Reactions  . Peanut-Containing Drug Products Swelling and Rash    MEDICATIONS: Current Outpatient Prescriptions on File Prior to Visit  Medication Sig Dispense Refill  . medroxyPROGESTERone (DEPO-PROVERA) 150 MG/ML injection Inject 150 mg into the muscle every 3 (three) months.    . CHERATUSSIN AC 100-10 MG/5ML syrup Take 10 mLs by mouth as needed for cough or congestion.   0  . ondansetron (ZOFRAN ODT) 4 MG disintegrating tablet 4mg  ODT q4 hours prn nausea/vomit (Patient not taking: Reported on 09/25/2016) 10 tablet 0  .  oxyCODONE-acetaminophen (PERCOCET) 5-325 MG per tablet Take 1 tablet by mouth every 6 (six) hours as needed for moderate pain. (Patient not taking: Reported on 09/25/2016) 20 tablet 0  . VENTOLIN HFA 108 (90 BASE) MCG/ACT inhaler Inhale 2 puffs into the lungs every 4 (four) hours as needed.  0   Current Facility-Administered Medications on File Prior to Visit  Medication Dose  Route Frequency Provider Last Rate Last Dose  . albuterol (PROVENTIL) (2.5 MG/3ML) 0.083% nebulizer solution 2.5 mg  2.5 mg Nebulization Once Darreld Mclean, MD        PAST MEDICAL HISTORY: Past Medical History:  Diagnosis Date  . Allergy   . Chest pain   . Constipation   . Gallbladder problem   . GERD (gastroesophageal reflux disease)   . Kidney problem     PAST SURGICAL HISTORY: Past Surgical History:  Procedure Laterality Date  . CESAREAN SECTION      SOCIAL HISTORY: Social History  Substance Use Topics  . Smoking status: Never Smoker  . Smokeless tobacco: Never Used  . Alcohol use No    FAMILY HISTORY: Family History  Problem Relation Age of Onset  . Hypertension Mother   . Hyperlipidemia Mother   . Sudden death Mother   . Kidney disease Mother   . Obesity Mother     ROS: Review of Systems  HENT: Positive for congestion (Nasal Stuffiness).   Eyes:       Wear Glasses  Respiratory: Positive for shortness of breath (with activity).   Cardiovascular: Negative for chest pain and orthopnea.       Chest Tightness  Gastrointestinal: Positive for constipation. Negative for abdominal pain.       Negative jaundice  Skin: Positive for itching.       Dryness  Psychiatric/Behavioral:       Stress    PHYSICAL EXAM: Blood pressure 138/85, pulse 95, temperature 98.7 F (37.1 C), resp. rate 14, height 5' (1.524 m), weight 177 lb (80.3 kg), SpO2 98 %. Body mass index is 34.57 kg/m. Physical Exam  Constitutional: She is oriented to person, place, and time. She appears well-developed and well-nourished.  Cardiovascular: Normal rate.   Pulmonary/Chest: Effort normal.  Musculoskeletal: Normal range of motion.  Neurological: She is oriented to person, place, and time.  Skin: Skin is warm and dry.  Psychiatric: She has a normal mood and affect. Her behavior is normal.  Vitals reviewed.   RECENT LABS AND TESTS: BMET    Component Value Date/Time   NA 139  09/18/2014 1808   K 3.5 09/18/2014 1808   CL 100 09/18/2014 1808   CO2 26 09/18/2014 1808   GLUCOSE 121 (H) 09/18/2014 1808   BUN 12 09/18/2014 1808   CREATININE 0.77 09/18/2014 1808   CALCIUM 10.1 09/18/2014 1808   GFRNONAA >90 09/18/2014 1808   GFRAA >90 09/18/2014 1808   No results found for: HGBA1C No results found for: INSULIN CBC    Component Value Date/Time   WBC 15.2 (H) 09/18/2014 1808   RBC 5.15 (H) 09/18/2014 1808   HGB 14.1 09/18/2014 1808   HCT 42.2 09/18/2014 1808   PLT 306 09/18/2014 1808   MCV 81.9 09/18/2014 1808   MCH 27.4 09/18/2014 1808   MCHC 33.4 09/18/2014 1808   RDW 13.4 09/18/2014 1808   LYMPHSABS 1.8 07/06/2012 1403   MONOABS 0.9 07/06/2012 1403   EOSABS 0.0 07/06/2012 1403   BASOSABS 0.0 07/06/2012 1403   Iron/TIBC/Ferritin/ %Sat No results found for: IRON, TIBC,  FERRITIN, IRONPCTSAT Lipid Panel  No results found for: CHOL, TRIG, HDL, CHOLHDL, VLDL, LDLCALC, LDLDIRECT Hepatic Function Panel     Component Value Date/Time   PROT 8.3 09/18/2014 2024   ALBUMIN 4.0 09/18/2014 2024   AST 526 (H) 09/18/2014 2024   ALT 358 (H) 09/18/2014 2024   ALKPHOS 79 09/18/2014 2024   BILITOT 1.4 (H) 09/18/2014 2024   BILIDIR 0.5 09/18/2014 2024   IBILI 0.9 09/18/2014 2024   No results found for: TSH  ECG  shows NSR with a rate of 90 BPM INDIRECT CALORIMETER done today shows a VO2 of 203 and a REE of 1413.    ASSESSMENT AND PLAN: Other fatigue - Plan: EKG 12-Lead, Comprehensive metabolic panel, CBC with Differential/Platelet, Hemoglobin A1c, Insulin, random, Lipid Panel With LDL/HDL Ratio, Vitamin B12, Folate, TSH, T4, free, T3, VITAMIN D 25 Hydroxy (Vit-D Deficiency, Fractures)  SOB (shortness of breath)  Elevated liver function tests - Plan: Amylase, Lipase, Hepatitis panel, acute  At risk for heart disease  Depression screening  Class 1 obesity without serious comorbidity with body mass index (BMI) of 34.0 to 34.9 in adult, unspecified obesity  type  PLAN:  Fatigue Takita was informed that her fatigue may be related to obesity, depression or many other causes. Labs will be ordered, and in the meanwhile Leina has agreed to work on diet, exercise and weight loss to help with fatigue. Proper sleep hygiene was discussed including the need for 7-8 hours of quality sleep each night. A sleep study was not ordered based on symptoms and Epworth score.  Dyspnea on exertion Tokiko's shortness of breath appears to be obesity related and exercise induced. She has agreed to work on weight loss and gradually increase exercise to treat her exercise induced shortness of breath. If Rashmi follows our instructions and loses weight without improvement of her shortness of breath, we will plan to refer to pulmonology. We will monitor this condition regularly. Keymoni agrees to this plan.  Elevated Liver Function Test We discussed the likely diagnosis of non alcoholic fatty liver disease today and how this condition is obesity related. Raiden was educated on her risk of developing NASH or even liver failure and the only proven treatment for NAFLD was weight loss. Eliane agreed to continue with her weight loss efforts with healthier diet and exercise as an essential part of her treatment plan.  Cardiovascular risk counselling Toree was given extended (at least 15 minutes) coronary artery disease prevention counseling today. She is 51 y.o. female and has risk factors for heart disease including obesity. We discussed intensive lifestyle modifications today with an emphasis on specific weight loss instructions and strategies. Pt was also informed of the importance of increasing exercise and decreasing saturated fats to help prevent heart disease.  Depression Screen Deserai had a negative depression screening. Depression is commonly associated with obesity and often results in emotional eating behaviors. We will monitor this closely and work on CBT to help  improve the non-hunger eating patterns. Referral to Psychology may be required if no improvement is seen as she continues in our clinic.  Obesity Baneza is currently in the action stage of change and her goal is to continue with weight loss efforts She has agreed to follow the Category 2 plan Calianne has been instructed to work up to a goal of 150 minutes of combined cardio and strengthening exercise per week for weight loss and overall health benefits. We discussed the following Behavioral Modification Stratagies today: increasing lean protein intake,  decreasing simple carbohydrates , increasing fiber rich foods and decrease liquid calories  Aicia has agreed to follow up with our clinic in 2 weeks. She was informed of the importance of frequent follow up visits to maximize her success with intensive lifestyle modifications for her multiple health conditions. She was informed we would discuss her lab results at her next visit unless there is a critical issue that needs to be addressed sooner. Kimisha agreed to keep her next visit at the agreed upon time to discuss these results.  I, Doreene Nest, am acting as scribe for Dennard Nip, MD  I have reviewed the above documentation for accuracy and completeness, and I agree with the above. -Dennard Nip, MD

## 2016-09-26 LAB — CBC WITH DIFFERENTIAL/PLATELET
BASOS: 1 %
Basophils Absolute: 0 10*3/uL (ref 0.0–0.2)
EOS (ABSOLUTE): 0.1 10*3/uL (ref 0.0–0.4)
EOS: 1 %
HEMATOCRIT: 41.4 % (ref 34.0–46.6)
Hemoglobin: 13.3 g/dL (ref 11.1–15.9)
Immature Grans (Abs): 0 10*3/uL (ref 0.0–0.1)
Immature Granulocytes: 0 %
Lymphocytes Absolute: 2.8 10*3/uL (ref 0.7–3.1)
Lymphs: 32 %
MCH: 26.5 pg — ABNORMAL LOW (ref 26.6–33.0)
MCHC: 32.1 g/dL (ref 31.5–35.7)
MCV: 83 fL (ref 79–97)
MONOCYTES: 6 %
Monocytes Absolute: 0.6 10*3/uL (ref 0.1–0.9)
Neutrophils Absolute: 5.3 10*3/uL (ref 1.4–7.0)
Neutrophils: 60 %
Platelets: 324 10*3/uL (ref 150–379)
RBC: 5.01 x10E6/uL (ref 3.77–5.28)
RDW: 14.5 % (ref 12.3–15.4)
WBC: 8.7 10*3/uL (ref 3.4–10.8)

## 2016-09-26 LAB — COMPREHENSIVE METABOLIC PANEL
ALT: 8 IU/L (ref 0–32)
AST: 13 IU/L (ref 0–40)
Albumin/Globulin Ratio: 1.2 (ref 1.2–2.2)
Albumin: 4.1 g/dL (ref 3.5–5.5)
Alkaline Phosphatase: 76 IU/L (ref 39–117)
BUN / CREAT RATIO: 15 (ref 9–23)
BUN: 10 mg/dL (ref 6–24)
Bilirubin Total: 0.4 mg/dL (ref 0.0–1.2)
CO2: 22 mmol/L (ref 18–29)
Calcium: 9.3 mg/dL (ref 8.7–10.2)
Chloride: 103 mmol/L (ref 96–106)
Creatinine, Ser: 0.68 mg/dL (ref 0.57–1.00)
GFR, EST AFRICAN AMERICAN: 118 mL/min/{1.73_m2} (ref >59)
GFR, EST NON AFRICAN AMERICAN: 102 mL/min/{1.73_m2} (ref >59)
GLOBULIN, TOTAL: 3.3 g/dL (ref 1.5–4.5)
GLUCOSE: 101 mg/dL — AB (ref 65–99)
Potassium: 4.6 mmol/L (ref 3.5–5.2)
SODIUM: 139 mmol/L (ref 134–144)
TOTAL PROTEIN: 7.4 g/dL (ref 6.0–8.5)

## 2016-09-26 LAB — LIPID PANEL WITH LDL/HDL RATIO
Cholesterol, Total: 192 mg/dL (ref 100–199)
HDL: 44 mg/dL (ref >39)
LDL Calculated: 133 mg/dL — ABNORMAL HIGH (ref 0–99)
LDl/HDL Ratio: 3 ratio (ref 0.0–3.2)
Triglycerides: 74 mg/dL (ref 0–149)
VLDL CHOLESTEROL CAL: 15 mg/dL (ref 5–40)

## 2016-09-26 LAB — HEMOGLOBIN A1C
Est. average glucose Bld gHb Est-mCnc: 120 mg/dL
Hgb A1c MFr Bld: 5.8 % — ABNORMAL HIGH (ref 4.8–5.6)

## 2016-09-26 LAB — HEPATITIS PANEL, ACUTE
Hep A IgM: NEGATIVE
Hep B C IgM: NEGATIVE
Hepatitis B Surface Ag: NEGATIVE

## 2016-09-26 LAB — T3: T3, Total: 136 ng/dL (ref 71–180)

## 2016-09-26 LAB — AMYLASE: Amylase: 39 U/L (ref 31–124)

## 2016-09-26 LAB — FOLATE: FOLATE: 7.9 ng/mL (ref >3.0)

## 2016-09-26 LAB — VITAMIN D 25 HYDROXY (VIT D DEFICIENCY, FRACTURES): VIT D 25 HYDROXY: 6.4 ng/mL — AB (ref 30.0–100.0)

## 2016-09-26 LAB — INSULIN, RANDOM: INSULIN: 17.5 u[IU]/mL (ref 2.6–24.9)

## 2016-09-26 LAB — TSH: TSH: 1.46 u[IU]/mL (ref 0.450–4.500)

## 2016-09-26 LAB — T4, FREE: Free T4: 1.2 ng/dL (ref 0.82–1.77)

## 2016-09-26 LAB — LIPASE: Lipase: 24 U/L (ref 14–72)

## 2016-09-26 LAB — VITAMIN B12: Vitamin B-12: 563 pg/mL (ref 232–1245)

## 2016-10-15 ENCOUNTER — Ambulatory Visit (INDEPENDENT_AMBULATORY_CARE_PROVIDER_SITE_OTHER): Payer: 59 | Admitting: Family Medicine

## 2016-10-15 VITALS — BP 138/84 | HR 84 | Temp 98.8°F | Ht 60.0 in | Wt 176.0 lb

## 2016-10-15 DIAGNOSIS — E669 Obesity, unspecified: Secondary | ICD-10-CM

## 2016-10-15 DIAGNOSIS — Z9189 Other specified personal risk factors, not elsewhere classified: Secondary | ICD-10-CM

## 2016-10-15 DIAGNOSIS — E784 Other hyperlipidemia: Secondary | ICD-10-CM | POA: Diagnosis not present

## 2016-10-15 DIAGNOSIS — Z6834 Body mass index (BMI) 34.0-34.9, adult: Secondary | ICD-10-CM | POA: Diagnosis not present

## 2016-10-15 DIAGNOSIS — R7303 Prediabetes: Secondary | ICD-10-CM | POA: Diagnosis not present

## 2016-10-15 DIAGNOSIS — E559 Vitamin D deficiency, unspecified: Secondary | ICD-10-CM | POA: Diagnosis not present

## 2016-10-15 DIAGNOSIS — E7849 Other hyperlipidemia: Secondary | ICD-10-CM

## 2016-10-15 MED ORDER — VITAMIN D (ERGOCALCIFEROL) 1.25 MG (50000 UNIT) PO CAPS: 50000.0000 [IU] | ORAL_CAPSULE | ORAL | 0 refills | Status: DC

## 2016-10-15 MED ORDER — METFORMIN HCL 500 MG PO TABS
500.0000 mg | ORAL_TABLET | Freq: Every day | ORAL | 0 refills | Status: DC
Start: 2016-10-15 — End: 2016-11-22

## 2016-10-15 NOTE — Progress Notes (Signed)
Office: [phone]  /  Fax: [phone]   HPI:   Chief Complaint: OBESITY Kristy Cook is here to discuss her progress with her obesity treatment plan. She is following her eating plan approximately 65 % of the time and states she is exercising 0 minutes 0 times per week. Kristy Cook didn't do well with the category 2 plan. She made lots of substitutions and struggled with lots of snacking on sugary high calorie foods. She also didn't eat all of her lean protein and vegetables. Instead she worked on increasing H2O to 32 ounces per day. She notes sabotage, especially from husband. Her weight is 176 lb (79.8 kg) today and has had a weight gain of 1 lb over a period of 2 to 3 weeks since her last visit. She has gained 1 lb since starting treatment with [place].  Vitamin D deficiency Kristy Cook has a new diagnosis of vitamin D deficiency. She is not currently taking prescription vit D. She has significant fatigue and denies nausea, vomiting or muscle weakness.  Pre-Diabetes Kristy Cook has a new diagnosis of prediabetes based on her elevated Hgb A1c at 5.8 and was informed this puts her at greater risk of developing diabetes. She found decreasing simple carbohydrates difficult over the last 2 weeks. She is not taking metformin currently and continues to work on diet and exercise to decrease risk of diabetes. She admits polyphagia and denies nausea or hypoglycemia.  At risk for diabetes Kristy Cook is at higher than average risk for developing diabetes due to her obesity and pre-diabetes. She currently denies polyuria or polydipsia.  Hyperlipidemia Kristy Cook has a new diagnosis of hyperlipidemia and has been trying to improve her cholesterol levels with intensive lifestyle modification including a low saturated fat diet, exercise and weight loss. She has positive family history of hyperlipidemia. Kristy Cook denies any chest pain, claudication or myalgias.  Wt Readings from Last 500 Encounters:  10/15/16 176 lb (79.8 kg)    09/25/16 177 lb (80.3 kg)  09/18/14 174 lb (78.9 kg)  06/14/14 172 lb 12.8 oz (78.4 kg)  07/06/12 150 lb (68 kg)     ALLERGIES: Allergies  Allergen Reactions  . Peanut-Containing Drug Products Swelling and Rash    MEDICATIONS: Current Outpatient Prescriptions on File Prior to Visit  Medication Sig Dispense Refill  . medroxyPROGESTERone (DEPO-PROVERA) 150 MG/ML injection Inject 150 mg into the muscle every 3 (three) months.     No current facility-administered medications on file prior to visit.     PAST MEDICAL HISTORY: Past Medical History:  Diagnosis Date  . Allergy   . Chest pain   . Constipation   . Gallbladder problem   . GERD (gastroesophageal reflux disease)   . Kidney problem     PAST SURGICAL HISTORY: Past Surgical History:  Procedure Laterality Date  . CESAREAN SECTION      SOCIAL HISTORY: Social History  Substance Use Topics  . Smoking status: Never Smoker  . Smokeless tobacco: Never Used  . Alcohol use No    FAMILY HISTORY: Family History  Problem Relation Age of Onset  . Hypertension Mother   . Hyperlipidemia Mother   . Sudden death Mother   . Kidney disease Mother   . Obesity Mother     ROS: Review of Systems  Constitutional: Positive for malaise/fatigue (significant fatigue) and weight loss.  Cardiovascular: Negative for chest pain and claudication.  Gastrointestinal: Negative for nausea and vomiting.  Genitourinary: Negative for frequency.  Musculoskeletal: Negative for myalgias.       Negative  muscle weakness  Endo/Heme/Allergies: Negative for polydipsia.       Polyphagia Negative hypoglycemia    PHYSICAL EXAM: Blood pressure 138/84, pulse 84, temperature 98.8 F (37.1 C), height 5' (1.524 m), weight 176 lb (79.8 kg), SpO2 99 %. Body mass index is 34.37 kg/m. Physical Exam  Constitutional: She appears well-developed and well-nourished.  Cardiovascular: Normal rate.   Pulmonary/Chest: Effort normal.  Musculoskeletal:  Normal range of motion.  Skin: Skin is warm and dry.  Psychiatric: She has a normal mood and affect. Her behavior is normal.  Vitals reviewed.   RECENT LABS AND TESTS: BMET    Component Value Date/Time   NA 139 09/25/2016 0940   K 4.6 09/25/2016 0940   CL 103 09/25/2016 0940   CO2 22 09/25/2016 0940   GLUCOSE 101 (H) 09/25/2016 0940   GLUCOSE 121 (H) 09/18/2014 1808   BUN 10 09/25/2016 0940   CREATININE 0.68 09/25/2016 0940   CALCIUM 9.3 09/25/2016 0940   GFRNONAA 102 09/25/2016 0940   GFRAA 118 09/25/2016 0940   Lab Results  Component Value Date   HGBA1C 5.8 (H) 09/25/2016   Lab Results  Component Value Date   INSULIN 17.5 09/25/2016   CBC    Component Value Date/Time   WBC 8.7 09/25/2016 0940   WBC 15.2 (H) 09/18/2014 1808   RBC 5.01 09/25/2016 0940   RBC 5.15 (H) 09/18/2014 1808   HGB 14.1 09/18/2014 1808   HCT 41.4 09/25/2016 0940   PLT 324 09/25/2016 0940   MCV 83 09/25/2016 0940   MCH 26.5 (L) 09/25/2016 0940   MCH 27.4 09/18/2014 1808   MCHC 32.1 09/25/2016 0940   MCHC 33.4 09/18/2014 1808   RDW 14.5 09/25/2016 0940   LYMPHSABS 2.8 09/25/2016 0940   MONOABS 0.9 07/06/2012 1403   EOSABS 0.1 09/25/2016 0940   BASOSABS 0.0 09/25/2016 0940   Iron/TIBC/Ferritin/ %Sat No results found for: IRON, TIBC, FERRITIN, IRONPCTSAT Lipid Panel     Component Value Date/Time   CHOL 192 09/25/2016 0940   TRIG 74 09/25/2016 0940   HDL 44 09/25/2016 0940   LDLCALC 133 (H) 09/25/2016 0940   Hepatic Function Panel     Component Value Date/Time   PROT 7.4 09/25/2016 0940   ALBUMIN 4.1 09/25/2016 0940   AST 13 09/25/2016 0940   ALT 8 09/25/2016 0940   ALKPHOS 76 09/25/2016 0940   BILITOT 0.4 09/25/2016 0940   BILIDIR 0.5 09/18/2014 2024   IBILI 0.9 09/18/2014 2024      Component Value Date/Time   TSH 1.460 09/25/2016 0940    ASSESSMENT AND PLAN: Vitamin D deficiency - Plan: Vitamin D, Ergocalciferol, (DRISDOL) 50000 units CAPS capsule  Prediabetes -  Plan: metFORMIN (GLUCOPHAGE) 500 MG tablet  Other hyperlipidemia  At risk for diabetes mellitus - Plan: metFORMIN (GLUCOPHAGE) 500 MG tablet  Class 1 obesity without serious comorbidity with body mass index (BMI) of 34.0 to 34.9 in adult, unspecified obesity type  PLAN:  Vitamin D Deficiency Kristy Cook was informed that low vitamin D levels contributes to fatigue and are associated with obesity, breast, and colon cancer. She agrees to start to take prescription Vit D @50 ,000 IU every week #4 with no refills and we will re-check labs in 3 months and will follow up for routine testing of vitamin D, at least 2-3 times per year. She was informed of the risk of over-replacement of vitamin D and agrees to not increase her dose unless he discusses this with [place] first. Kristy Cook agrees to  follow up with our clinic in 2 weeks.  Pre-Diabetes Kristy Cook will continue to work on weight loss, exercise, and decreasing simple carbohydrates in her diet to help decrease the risk of diabetes. We dicussed metformin including benefits and risks. She was informed that eating too many simple carbohydrates or too many calories at one sitting increases the likelihood of GI side effects. Kristy Cook requested metformin for now and a prescription was written today for metformin 500 mg qd #30 with no refills. Kristy Cook agreed to follow up with [place] as directed to monitor her progress.  Diabetes risk counselling Kristy Cook was given extended (at least 30 minutes) diabetes prevention counseling today. She is 51 y.o. female and has risk factors for diabetes including obesity and pre-diabetes. We discussed intensive lifestyle modifications today with an emphasis on weight loss as well as increasing exercise and decreasing simple carbohydrates in her diet.  Hyperlipidemia Kristy Cook was informed of the American Heart Association Guidelines emphasizing intensive lifestyle modifications as the first line treatment for hyperlipidemia. We discussed many  lifestyle modifications today in depth, and Kristy Cook will continue to work on decreasing saturated fats such as fatty red meat, butter and many fried foods. She will also increase vegetables and lean protein in her diet and continue to work on exercise and weight loss efforts. We will re-check labs in 3 months.  Obesity Kristy Cook is currently in the action stage of change. As such, her goal is to continue with weight loss efforts She has agreed to keep a food journal with 1100 to 1200 calories and 70+ grams of protein daily Kristy Cook has been instructed to work up to a goal of 150 minutes of combined cardio and strengthening exercise per week for weight loss and overall health benefits. We discussed the following Behavioral Modification Stratagies today: increasing lean protein intake, dealing with family or coworker sabotage, decrease snacking  and ways to avoid night time snacking  Kristy Cook has agreed to follow up with our clinic in 2 weeks. She was informed of the importance of frequent follow up visits to maximize her success with intensive lifestyle modifications for her multiple health conditions.  I, Doreene Nest, am acting as scribe for Dennard Nip, MD  I have reviewed the above documentation for accuracy and completeness, and I agree with the above. -Dennard Nip, MD

## 2016-10-29 ENCOUNTER — Encounter (INDEPENDENT_AMBULATORY_CARE_PROVIDER_SITE_OTHER): Payer: Self-pay

## 2016-10-29 ENCOUNTER — Ambulatory Visit (INDEPENDENT_AMBULATORY_CARE_PROVIDER_SITE_OTHER): Payer: 59 | Admitting: Family Medicine

## 2016-10-30 ENCOUNTER — Ambulatory Visit (INDEPENDENT_AMBULATORY_CARE_PROVIDER_SITE_OTHER): Payer: 59 | Admitting: Family Medicine

## 2016-11-07 ENCOUNTER — Ambulatory Visit (INDEPENDENT_AMBULATORY_CARE_PROVIDER_SITE_OTHER): Payer: 59 | Admitting: Family Medicine

## 2016-11-08 ENCOUNTER — Ambulatory Visit (INDEPENDENT_AMBULATORY_CARE_PROVIDER_SITE_OTHER): Payer: 59 | Admitting: Family Medicine

## 2016-11-08 VITALS — BP 151/85 | HR 81 | Temp 98.6°F | Ht 60.0 in | Wt 176.0 lb

## 2016-11-08 DIAGNOSIS — Z6834 Body mass index (BMI) 34.0-34.9, adult: Secondary | ICD-10-CM | POA: Diagnosis not present

## 2016-11-08 DIAGNOSIS — E559 Vitamin D deficiency, unspecified: Secondary | ICD-10-CM

## 2016-11-08 DIAGNOSIS — R030 Elevated blood-pressure reading, without diagnosis of hypertension: Secondary | ICD-10-CM | POA: Diagnosis not present

## 2016-11-08 DIAGNOSIS — E669 Obesity, unspecified: Secondary | ICD-10-CM | POA: Diagnosis not present

## 2016-11-08 MED ORDER — VITAMIN D (ERGOCALCIFEROL) 1.25 MG (50000 UNIT) PO CAPS: 50000.0000 [IU] | ORAL_CAPSULE | ORAL | 0 refills | Status: DC

## 2016-11-08 NOTE — Progress Notes (Signed)
Office: [phone]  /  Fax: [phone]   HPI:   Chief Complaint: OBESITY Kristy Cook is here to discuss her progress with her obesity treatment plan. She is on the  keep a food journal with 1100 to 1200 calories and 70+ grams of protein  and is following her eating plan approximately 25 % of the time. She states she is exercising 0 minutes 0 times per week. [name] has done well maintaining weight in the last 2 weeks. She has been renovating her kitchen and increased eating out. She hasn't been journaling but is working on increasing protein and portion control. Her weight is 176 lb (79.8 kg) today and has maintained weight over a period of 3 weeks since her last visit. She has lost 1 lb since starting treatment with [place].  Vitamin D deficiency Kristy Cook has a diagnosis of vitamin D deficiency. She is currently stable on vit D and denies nausea, vomiting or muscle weakness.  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 She denies chest pain or headache. Blood pressure is normally in goal range.  ALLERGIES: Allergies  Allergen Reactions  . Peanut-Containing Drug Products Swelling and Rash    MEDICATIONS: Current Outpatient Prescriptions on File Prior to Visit  Medication Sig Dispense Refill  . medroxyPROGESTERone (DEPO-PROVERA) 150 MG/ML injection Inject 150 mg into the muscle every 3 (three) months.    . metFORMIN (GLUCOPHAGE) 500 MG tablet Take 1 tablet (500 mg total) by mouth daily with breakfast. 30 tablet 0  . Vitamin D, Ergocalciferol, (DRISDOL) 50000 units CAPS capsule Take 1 capsule (50,000 Units total) by mouth every 7 (seven) days. 4 capsule 0   No current facility-administered medications on file prior to visit.     PAST MEDICAL HISTORY: Past Medical History:  Diagnosis Date  . Allergy   . Chest pain   . Constipation   . Gallbladder problem   . GERD (gastroesophageal reflux disease)     . Kidney problem     PAST SURGICAL HISTORY: Past Surgical History:  Procedure Laterality Date  . CESAREAN SECTION      SOCIAL HISTORY: Social History  Substance Use Topics  . Smoking status: Never Smoker  . Smokeless tobacco: Never Used  . Alcohol use No    FAMILY HISTORY: Family History  Problem Relation Age of Onset  . Hypertension Mother   . Hyperlipidemia Mother   . Sudden death Mother   . Kidney disease Mother   . Obesity Mother     ROS: Review of Systems  Constitutional: Negative for weight loss.  Cardiovascular: Negative for chest pain.  Gastrointestinal: Negative for nausea and vomiting.  Musculoskeletal:       Negative muscle weakness  Neurological: Negative for headaches.  Psychiatric/Behavioral:       Stress    PHYSICAL EXAM: Blood pressure (!) 151/85, pulse 81, temperature 98.6 F (37 C), height 5' (1.524 m), weight 176 lb (79.8 kg), SpO2 98 %. Body mass index is 34.37 kg/m. Physical Exam  Constitutional: She is oriented to person, place, and time. She appears well-developed and well-nourished.  Cardiovascular: Normal rate.   Pulmonary/Chest: Effort normal.  Musculoskeletal: Normal range of motion.  Neurological: She is oriented to person, place, and time.  Skin: Skin is warm and dry.  Psychiatric: She has a normal mood and affect. Her behavior is normal.  Vitals reviewed.   RECENT LABS AND TESTS: BMET    Component Value Date/Time   NA 139 09/25/2016  0940   K 4.6 09/25/2016 0940   CL 103 09/25/2016 0940   CO2 22 09/25/2016 0940   GLUCOSE 101 (H) 09/25/2016 0940   GLUCOSE 121 (H) 09/18/2014 1808   BUN 10 09/25/2016 0940   CREATININE 0.68 09/25/2016 0940   CALCIUM 9.3 09/25/2016 0940   GFRNONAA 102 09/25/2016 0940   GFRAA 118 09/25/2016 0940   Lab Results  Component Value Date   HGBA1C 5.8 (H) 09/25/2016   Lab Results  Component Value Date   INSULIN 17.5 09/25/2016   CBC    Component Value Date/Time   WBC 8.7 09/25/2016  0940   WBC 15.2 (H) 09/18/2014 1808   RBC 5.01 09/25/2016 0940   RBC 5.15 (H) 09/18/2014 1808   HGB 14.1 09/18/2014 1808   HCT 41.4 09/25/2016 0940   PLT 324 09/25/2016 0940   MCV 83 09/25/2016 0940   MCH 26.5 (L) 09/25/2016 0940   MCH 27.4 09/18/2014 1808   MCHC 32.1 09/25/2016 0940   MCHC 33.4 09/18/2014 1808   RDW 14.5 09/25/2016 0940   LYMPHSABS 2.8 09/25/2016 0940   MONOABS 0.9 07/06/2012 1403   EOSABS 0.1 09/25/2016 0940   BASOSABS 0.0 09/25/2016 0940   Iron/TIBC/Ferritin/ %Sat No results found for: IRON, TIBC, FERRITIN, IRONPCTSAT Lipid Panel     Component Value Date/Time   CHOL 192 09/25/2016 0940   TRIG 74 09/25/2016 0940   HDL 44 09/25/2016 0940   LDLCALC 133 (H) 09/25/2016 0940   Hepatic Function Panel     Component Value Date/Time   PROT 7.4 09/25/2016 0940   ALBUMIN 4.1 09/25/2016 0940   AST 13 09/25/2016 0940   ALT 8 09/25/2016 0940   ALKPHOS 76 09/25/2016 0940   BILITOT 0.4 09/25/2016 0940   BILIDIR 0.5 09/18/2014 2024   IBILI 0.9 09/18/2014 2024      Component Value Date/Time   TSH 1.460 09/25/2016 0940    ASSESSMENT AND PLAN: Vitamin D deficiency - Plan: Vitamin D, Ergocalciferol, (DRISDOL) 50000 units CAPS capsule  Blood pressure elevated without history of HTN  Class 1 obesity without serious comorbidity with body mass index (BMI) of 34.0 to 34.9 in adult, unspecified obesity type  PLAN:  Vitamin D Deficiency Kristy Cook was informed that low vitamin D levels contributes to fatigue and are associated with obesity, breast, and colon cancer. She agrees to continue to take prescription Vit D @50 ,000 IU every week, we will refill for 1 month and will follow up for routine testing of vitamin D, at least 2-3 times per year. She was informed of the risk of over-replacement of vitamin D and agrees to not increase her dose unless he discusses this with [place] first. Kristy Cook agrees to follow up with our clinic in 2 weeks.  Elevated Blood Pressure without  history of hypertension We will re-check blood pressure in 2 weeks and Ron agrees to continue with weight loss and follow up as directed.  Obesity Kristy Cook is currently in the action stage of change. As such, her goal is to continue with weight loss efforts She has agreed to keep a food journal with 1100 to 1200 calories and 70 grams of protein  Willona has been instructed to work up to a goal of 150 minutes of combined cardio and strengthening exercise per week for weight loss and overall health benefits. We discussed the following Behavioral Modification Strategies today: increasing lean protein intake and increasing lower sugar fruits  Jaid has agreed to follow up with our clinic in 2 weeks. She  was informed of the importance of frequent follow up visits to maximize her success with intensive lifestyle modifications for her multiple health conditions.  I, Doreene Nest, am acting as scribe for Dennard Nip, MD  I have reviewed the above documentation for accuracy and completeness, and I agree with the above. -Dennard Nip, MD

## 2016-11-22 ENCOUNTER — Ambulatory Visit (INDEPENDENT_AMBULATORY_CARE_PROVIDER_SITE_OTHER): Payer: 59 | Admitting: Family Medicine

## 2016-11-22 VITALS — BP 142/82 | HR 87 | Temp 98.3°F | Ht 60.0 in | Wt 177.0 lb

## 2016-11-22 DIAGNOSIS — R7303 Prediabetes: Secondary | ICD-10-CM | POA: Diagnosis not present

## 2016-11-22 DIAGNOSIS — E669 Obesity, unspecified: Secondary | ICD-10-CM | POA: Diagnosis not present

## 2016-11-22 DIAGNOSIS — I10 Essential (primary) hypertension: Secondary | ICD-10-CM | POA: Diagnosis not present

## 2016-11-22 DIAGNOSIS — Z6834 Body mass index (BMI) 34.0-34.9, adult: Secondary | ICD-10-CM

## 2016-11-22 MED ORDER — METFORMIN HCL 500 MG PO TABS: 500.0000 mg | ORAL_TABLET | Freq: Every day | ORAL | 0 refills | Status: DC

## 2016-11-22 MED ORDER — HYDROCHLOROTHIAZIDE 12.5 MG PO CAPS: 12.5000 mg | ORAL_CAPSULE | Freq: Every day | ORAL | 0 refills | Status: DC

## 2016-11-26 NOTE — Progress Notes (Signed)
Office: [phone]  /  Fax: [phone]   HPI:   Chief Complaint: OBESITY Kristy Cook is here to discuss her progress with her obesity treatment plan. She is on the  keep a food journal with 1100 to 1200 calories and 70 grams of protein  and is following her eating plan approximately 25 % of the time. She states she is exercising 0 minutes 0 times per week. Kristy Cook has been struggling over the past 2 weeks planning an preparing meals because of house renovations.  Her weight is 177 lb (80.3 kg) today and has had a weight gain of 1 pound over a period of 2 weeks since her last visit. She has lost 0 lbs since starting treatment with [place].  Pre-Diabetes Kristy Cook has a diagnosis of pre-diabetes based on her elevated Hgb A1c and was informed this puts her at greater risk of developing diabetes. She is taking metformin currently and continues to work on diet and exercise to decrease risk of diabetes. She denies polyphagia, nausea or hypoglycemia.  Hypertension Kristy Cook is a 51 y.o. female with a new diagnosis of hypertension. Blood pressure has been elevated on more than two occasions. Kristy Cook denies chest pain, headache or shortness of breath on exertion. She is working weight loss to help control her blood pressure with the goal of decreasing her risk of heart attack and stroke. Roneisas blood pressure is not currently controlled.   ALLERGIES: Allergies  Allergen Reactions   Peanut-Containing Drug Products Swelling and Rash    MEDICATIONS: Current Outpatient Prescriptions on File Prior to Visit  Medication Sig Dispense Refill   medroxyPROGESTERone (DEPO-PROVERA) 150 MG/ML injection Inject 150 mg into the muscle every 3 (three) months.     Vitamin D, Ergocalciferol, (DRISDOL) 50000 units CAPS capsule Take 1 capsule (50,000 Units total) by mouth every 7 (seven) days. 4 capsule 0   No current facility-administered medications on file prior to visit.     PAST MEDICAL  HISTORY: Past Medical History:  Diagnosis Date   Allergy    Chest pain    Constipation    Gallbladder problem    GERD (gastroesophageal reflux disease)    Kidney problem     PAST SURGICAL HISTORY: Past Surgical History:  Procedure Laterality Date   CESAREAN SECTION      SOCIAL HISTORY: Social History  Substance Use Topics   Smoking status: Never Smoker   Smokeless tobacco: Never Used   Alcohol use No    FAMILY HISTORY: Family History  Problem Relation Age of Onset   Hypertension Mother    Hyperlipidemia Mother    Sudden death Mother    Kidney disease Mother    Obesity Mother     ROS: Review of Systems  Constitutional: Negative for weight loss.  Respiratory: Negative for shortness of breath.   Cardiovascular: Negative for chest pain.  Gastrointestinal: Negative for nausea.  Neurological: Negative for headaches.  Endo/Heme/Allergies:       Negative polyphagia Negative hypoglycemia    PHYSICAL EXAM: Blood pressure (!) 142/82, pulse 87, temperature 98.3 F (36.8 C), temperature source Oral, height 5' (1.524 m), weight 177 lb (80.3 kg), SpO2 99 %. Body mass index is 34.57 kg/m. Physical Exam  Constitutional: She is oriented to person, place, and time. She appears well-developed and well-nourished.  Cardiovascular: Normal rate.   Pulmonary/Chest: Effort normal.  Musculoskeletal: Normal range of motion.  Neurological: She is oriented to person, place, and time.  Skin: Skin is warm and dry.  Psychiatric: She has  a normal mood and affect. Her behavior is normal.  Vitals reviewed.   RECENT LABS AND TESTS: BMET    Component Value Date/Time   NA 139 09/25/2016 0940   K 4.6 09/25/2016 0940   CL 103 09/25/2016 0940   CO2 22 09/25/2016 0940   GLUCOSE 101 (H) 09/25/2016 0940   GLUCOSE 121 (H) 09/18/2014 1808   BUN 10 09/25/2016 0940   CREATININE 0.68 09/25/2016 0940   CALCIUM 9.3 09/25/2016 0940   GFRNONAA 102 09/25/2016 0940   GFRAA 118  09/25/2016 0940   Lab Results  Component Value Date   HGBA1C 5.8 (H) 09/25/2016   Lab Results  Component Value Date   INSULIN 17.5 09/25/2016   CBC    Component Value Date/Time   WBC 8.7 09/25/2016 0940   WBC 15.2 (H) 09/18/2014 1808   RBC 5.01 09/25/2016 0940   RBC 5.15 (H) 09/18/2014 1808   HGB 14.1 09/18/2014 1808   HCT 41.4 09/25/2016 0940   PLT 324 09/25/2016 0940   MCV 83 09/25/2016 0940   MCH 26.5 (L) 09/25/2016 0940   MCH 27.4 09/18/2014 1808   MCHC 32.1 09/25/2016 0940   MCHC 33.4 09/18/2014 1808   RDW 14.5 09/25/2016 0940   LYMPHSABS 2.8 09/25/2016 0940   MONOABS 0.9 07/06/2012 1403   EOSABS 0.1 09/25/2016 0940   BASOSABS 0.0 09/25/2016 0940   Iron/TIBC/Ferritin/ %Sat No results found for: IRON, TIBC, FERRITIN, IRONPCTSAT Lipid Panel     Component Value Date/Time   CHOL 192 09/25/2016 0940   TRIG 74 09/25/2016 0940   HDL 44 09/25/2016 0940   LDLCALC 133 (H) 09/25/2016 0940   Hepatic Function Panel     Component Value Date/Time   PROT 7.4 09/25/2016 0940   ALBUMIN 4.1 09/25/2016 0940   AST 13 09/25/2016 0940   ALT 8 09/25/2016 0940   ALKPHOS 76 09/25/2016 0940   BILITOT 0.4 09/25/2016 0940   BILIDIR 0.5 09/18/2014 2024   IBILI 0.9 09/18/2014 2024      Component Value Date/Time   TSH 1.460 09/25/2016 0940    ASSESSMENT AND PLAN: Prediabetes - Plan: metFORMIN (GLUCOPHAGE) 500 MG tablet  Essential hypertension - Plan: hydrochlorothiazide (MICROZIDE) 12.5 MG capsule  Class 1 obesity without serious comorbidity with body mass index (BMI) of 34.0 to 34.9 in adult, unspecified obesity type  PLAN:  Pre-Diabetes Kristy Cook will continue to work on weight loss, exercise, and decreasing simple carbohydrates in her diet to help decrease the risk of diabetes. We dicussed metformin including benefits and risks. She was informed that eating too many simple carbohydrates or too many calories at one sitting increases the likelihood of GI side effects. Kristy Cook  requested metformin for now and a prescription was written today for 1 month refill. Kristy Cook agreed to follow up with [place] as directed to monitor her progress.  Hypertension We discussed sodium restriction, working on healthy weight loss, and a regular exercise program as the means to achieve improved blood pressure control. Lakendria agreed with this plan and agreed to follow up as directed. We will continue to monitor her blood pressure as well as her progress with the above lifestyle modifications. She agrees to start to take HCTZ 12.5 mg once daily and will watch for signs of hypotension as she continues her lifestyle modifications.  Obesity It is questionable if Deserae is in the action stage of change, so the plan is for Verlin to maintain weight until kitchen cabinets are finished. As such, her goal is to maintain  weight for now She has agreed to keep a food journal with 1100 to 1200 calories and 70 grams of protein  Jamillia has been instructed to work up to a goal of 150 minutes of combined cardio and strengthening exercise per week for weight loss and overall health benefits. We discussed the following Behavioral Modification Strategies today: increasing lean protein intake, work on meal planning and easy cooking plans and decrease liquid calories  Kumiko has agreed to follow up with our clinic in 2 weeks. She was informed of the importance of frequent follow up visits to maximize her success with intensive lifestyle modifications for her multiple health conditions.  I, Doreene Nest, am acting as scribe for Dennard Nip, MD  I have reviewed the above documentation for accuracy and completeness, and I agree with the above. -Dennard Nip, MD  OBESITY BEHAVIORAL INTERVENTION VISIT  Today's visit was # 4 out of 22.  Starting weight: 177 Starting date: 09/25/16 Today's weight : 177 lbs Today's date: 11/22/2016 Total lbs lost to date: 0 (Patients must lose 7 lbs in the first 6 months to  continue with counseling)   ASK: We discussed the diagnosis of obesity with [place] today and Tinie agreed to give [place] permission to discuss obesity behavioral modification therapy today.  ASSESS: Reema has the diagnosis of obesity and her BMI today is 34.6 Marleni is in the action stage of change   ADVISE: Valetta was educated on the multiple health risks of obesity as well as the benefit of weight loss to improve her health. She was advised of the need for long term treatment and the importance of lifestyle modifications.  AGREE: Multiple dietary modification options and treatment options were discussed and  Milta agreed to keep a food journal with 1100 to 1200 calories and 70 grams of protein  We discussed the following Behavioral Modification Strategies today: increasing lean protein intake, work on meal planning and easy cooking plans and decrease liquid calories

## 2016-12-11 ENCOUNTER — Ambulatory Visit (INDEPENDENT_AMBULATORY_CARE_PROVIDER_SITE_OTHER): Payer: 59 | Admitting: Family Medicine

## 2016-12-11 VITALS — BP 126/82 | HR 78 | Temp 98.5°F | Ht 60.0 in | Wt 174.0 lb

## 2016-12-11 DIAGNOSIS — E559 Vitamin D deficiency, unspecified: Secondary | ICD-10-CM

## 2016-12-11 DIAGNOSIS — I10 Essential (primary) hypertension: Secondary | ICD-10-CM | POA: Diagnosis not present

## 2016-12-11 DIAGNOSIS — E669 Obesity, unspecified: Secondary | ICD-10-CM | POA: Diagnosis not present

## 2016-12-11 DIAGNOSIS — Z6834 Body mass index (BMI) 34.0-34.9, adult: Secondary | ICD-10-CM | POA: Diagnosis not present

## 2016-12-11 MED ORDER — VITAMIN D (ERGOCALCIFEROL) 1.25 MG (50000 UNIT) PO CAPS: 50000.0000 [IU] | ORAL_CAPSULE | ORAL | 0 refills | Status: DC

## 2016-12-11 NOTE — Progress Notes (Signed)
Office: [phone]  /  Fax: [phone]   HPI:   Chief Complaint: OBESITY Kristy Cook is here to discuss her progress with her obesity treatment plan. She is on the  keep a food journal with 1100 to 1200 calories and 70 grams of protein  and is following her eating plan approximately 90 % of the time. She states she is exercising 0 minutes 0 times per week. Kristy Cook continues to do well with weight loss. She has been following plan and is more motivated to continue to follow that plan. Her weight is 174 lb (78.9 kg) today and has had a weight loss of 3 pounds over a period of 2 to 3 weeks since her last visit. She has lost 3 lbs since starting treatment with [place].  Vitamin D deficiency Kristy Cook has a diagnosis of vitamin D deficiency. She is currently taking vit D and denies nausea, vomiting or muscle weakness.  Hypertension Kristy Cook is a 51 y.o. female with hypertension, blood pressure is currently stable. Kristy Cook denies chest pain, headache or shortness of breath on exertion. She is working weight loss to help control her blood pressure with the goal of decreasing her risk of heart attack and stroke. Roneisas blood pressure is currently controlled.   ALLERGIES: Allergies  Allergen Reactions   Peanut-Containing Drug Products Swelling and Rash    MEDICATIONS: Current Outpatient Prescriptions on File Prior to Visit  Medication Sig Dispense Refill   hydrochlorothiazide (MICROZIDE) 12.5 MG capsule Take 1 capsule (12.5 mg total) by mouth daily. 30 capsule 0   medroxyPROGESTERone (DEPO-PROVERA) 150 MG/ML injection Inject 150 mg into the muscle every 3 (three) months.     metFORMIN (GLUCOPHAGE) 500 MG tablet Take 1 tablet (500 mg total) by mouth daily with breakfast. 30 tablet 0   Vitamin D, Ergocalciferol, (DRISDOL) 50000 units CAPS capsule Take 1 capsule (50,000 Units total) by mouth every 7 (seven) days. 4 capsule 0   No current facility-administered medications on file  prior to visit.     PAST MEDICAL HISTORY: Past Medical History:  Diagnosis Date   Allergy    Chest pain    Constipation    Gallbladder problem    GERD (gastroesophageal reflux disease)    Kidney problem     PAST SURGICAL HISTORY: Past Surgical History:  Procedure Laterality Date   CESAREAN SECTION      SOCIAL HISTORY: Social History  Substance Use Topics   Smoking status: Never Smoker   Smokeless tobacco: Never Used   Alcohol use No    FAMILY HISTORY: Family History  Problem Relation Age of Onset   Hypertension Mother    Hyperlipidemia Mother    Sudden death Mother    Kidney disease Mother    Obesity Mother     ROS: Review of Systems  Constitutional: Positive for weight loss.  Respiratory: Negative for shortness of breath (on exertion).   Cardiovascular: Negative for chest pain.  Gastrointestinal: Negative for nausea and vomiting.  Musculoskeletal:       Negative muscle weakness  Neurological: Negative for headaches.    PHYSICAL EXAM: Blood pressure 126/82, pulse 78, temperature 98.5 F (36.9 C), temperature source Oral, height 5' (1.524 m), weight 174 lb (78.9 kg), SpO2 98 %. Body mass index is 33.98 kg/m. Physical Exam  Constitutional: She is oriented to person, place, and time. She appears well-developed and well-nourished.  Cardiovascular: Normal rate.   Pulmonary/Chest: Effort normal.  Musculoskeletal: Normal range of motion.  Neurological: She is oriented to person,  place, and time.  Skin: Skin is warm and dry.  Psychiatric: She has a normal mood and affect. Her behavior is normal.  Vitals reviewed.   RECENT LABS AND TESTS: BMET    Component Value Date/Time   NA 139 09/25/2016 0940   K 4.6 09/25/2016 0940   CL 103 09/25/2016 0940   CO2 22 09/25/2016 0940   GLUCOSE 101 (H) 09/25/2016 0940   GLUCOSE 121 (H) 09/18/2014 1808   BUN 10 09/25/2016 0940   CREATININE 0.68 09/25/2016 0940   CALCIUM 9.3 09/25/2016 0940   GFRNONAA  102 09/25/2016 0940   GFRAA 118 09/25/2016 0940   Lab Results  Component Value Date   HGBA1C 5.8 (H) 09/25/2016   Lab Results  Component Value Date   INSULIN 17.5 09/25/2016   CBC    Component Value Date/Time   WBC 8.7 09/25/2016 0940   WBC 15.2 (H) 09/18/2014 1808   RBC 5.01 09/25/2016 0940   RBC 5.15 (H) 09/18/2014 1808   HGB 13.3 09/25/2016 0940   HCT 41.4 09/25/2016 0940   PLT 324 09/25/2016 0940   MCV 83 09/25/2016 0940   MCH 26.5 (L) 09/25/2016 0940   MCH 27.4 09/18/2014 1808   MCHC 32.1 09/25/2016 0940   MCHC 33.4 09/18/2014 1808   RDW 14.5 09/25/2016 0940   LYMPHSABS 2.8 09/25/2016 0940   MONOABS 0.9 07/06/2012 1403   EOSABS 0.1 09/25/2016 0940   BASOSABS 0.0 09/25/2016 0940   Iron/TIBC/Ferritin/ %Sat No results found for: IRON, TIBC, FERRITIN, IRONPCTSAT Lipid Panel     Component Value Date/Time   CHOL 192 09/25/2016 0940   TRIG 74 09/25/2016 0940   HDL 44 09/25/2016 0940   LDLCALC 133 (H) 09/25/2016 0940   Hepatic Function Panel     Component Value Date/Time   PROT 7.4 09/25/2016 0940   ALBUMIN 4.1 09/25/2016 0940   AST 13 09/25/2016 0940   ALT 8 09/25/2016 0940   ALKPHOS 76 09/25/2016 0940   BILITOT 0.4 09/25/2016 0940   BILIDIR 0.5 09/18/2014 2024   IBILI 0.9 09/18/2014 2024      Component Value Date/Time   TSH 1.460 09/25/2016 0940    ASSESSMENT AND PLAN: Vitamin D deficiency - Plan: Vitamin D, Ergocalciferol, (DRISDOL) 50000 units CAPS capsule  Essential hypertension  Class 1 obesity without serious comorbidity with body mass index (BMI) of 34.0 to 34.9 in adult, unspecified obesity type  PLAN:  Vitamin D Deficiency Kristy Cook was informed that low vitamin D levels contributes to fatigue and are associated with obesity, breast, and colon cancer. She agrees to continue to take prescription Vit D @50 ,000 IU every week, we will refill for 1 month and will follow up for routine testing of vitamin D, at least 2-3 times per year. She was  informed of the risk of over-replacement of vitamin D and agrees to not increase her dose unless he discusses this with [place] first. Kristy Cook agrees to follow up with our clinic in 2 to 3 weeks.  Hypertension We discussed sodium restriction, working on healthy weight loss, and a regular exercise program as the means to achieve improved blood pressure control. Kristy Cook agreed with this plan and agreed to follow up as directed. We will continue to monitor her blood pressure as well as her progress with the above lifestyle modifications. She agrees to continue HCTZ 12.5 mg once daily #30 with no refills and will watch for signs of hypotension as she continues her lifestyle modifications.  Obesity Kristy Cook is currently in the  action stage of change. As such, her goal is to continue with weight loss efforts She has agreed to keep a food journal with 1100 to 1200 calories and 70+ grams of protein  Kristy Cook has been instructed to work up to a goal of 150 minutes of combined cardio and strengthening exercise per week for weight loss and overall health benefits. We discussed the following Behavioral Modification Strategies today: increasing lean protein intake and meal planning & cooking strategies  Kristy Cook has agreed to follow up with our clinic in 2 to 3 weeks. She was informed of the importance of frequent follow up visits to maximize her success with intensive lifestyle modifications for her multiple health conditions.  I, Doreene Nest, am acting as transcriptionist for Dennard Nip, MD  I have reviewed the above documentation for accuracy and completeness, and I agree with the above. -Dennard Nip, MD   OBESITY BEHAVIORAL INTERVENTION VISIT  Today's visit was # 5 out of 22.  Starting weight: 177 lbs Starting date: 09/25/16 Today's weight : 174 lbs Today's date: 12/11/2016 Total lbs lost to date: 3 (Patients must lose 7 lbs in the first 6 months to continue with counseling)   ASK: We discussed  the diagnosis of obesity with Kristy Cook today and Kristy Cook agreed to give [place] permission to discuss obesity behavioral modification therapy today.  ASSESS: Kristy Cook has the diagnosis of obesity and her BMI today is 34.1 Kristy Cook is in the action stage of change   ADVISE: Kristy Cook was educated on the multiple health risks of obesity as well as the benefit of weight loss to improve her health. She was advised of the need for long term treatment and the importance of lifestyle modifications.  AGREE: Multiple dietary modification options and treatment options were discussed and  Kristy Cook agreed to keep a food journal with 1100 to 1200 calories and 70+ grams of protein  We discussed the following Behavioral Modification Strategies today: increasing lean protein intake and meal planning & cooking strategies

## 2016-12-27 ENCOUNTER — Other Ambulatory Visit (INDEPENDENT_AMBULATORY_CARE_PROVIDER_SITE_OTHER): Payer: Self-pay

## 2016-12-27 DIAGNOSIS — I10 Essential (primary) hypertension: Secondary | ICD-10-CM

## 2016-12-27 MED ORDER — HYDROCHLOROTHIAZIDE 12.5 MG PO CAPS: 12.5000 mg | ORAL_CAPSULE | Freq: Every day | ORAL | 0 refills | Status: DC

## 2017-01-02 ENCOUNTER — Ambulatory Visit (INDEPENDENT_AMBULATORY_CARE_PROVIDER_SITE_OTHER): Payer: 59 | Admitting: Family Medicine

## 2017-01-02 VITALS — BP 106/74 | HR 88 | Temp 98.3°F | Ht 60.0 in | Wt 174.0 lb

## 2017-01-02 DIAGNOSIS — Z6834 Body mass index (BMI) 34.0-34.9, adult: Secondary | ICD-10-CM | POA: Diagnosis not present

## 2017-01-02 DIAGNOSIS — E669 Obesity, unspecified: Secondary | ICD-10-CM

## 2017-01-02 DIAGNOSIS — E559 Vitamin D deficiency, unspecified: Secondary | ICD-10-CM | POA: Diagnosis not present

## 2017-01-02 MED ORDER — VITAMIN D (ERGOCALCIFEROL) 1.25 MG (50000 UNIT) PO CAPS: 50000.0000 [IU] | ORAL_CAPSULE | ORAL | 0 refills | Status: DC

## 2017-01-02 NOTE — Progress Notes (Signed)
Office: [phone]  /  Fax: [phone]   HPI:   Chief Complaint: OBESITY Kristy Cook is here to discuss her progress with her obesity treatment plan. She is on the  follow the Category 2 plan and is following her eating plan approximately 50 % of the time. She states she is exercising 0 minutes 0 times per week. Kristy Cook has been trying to portion control/smart choices while going through [employer]. She is now ready to get back on track. Her weight is 174 lb (78.9 kg) today and has maintained weight over a period of 3 weeks since her last visit. She has lost 3 lbs since starting treatment with [place].  Vitamin D deficiency Kristy Cook has a diagnosis of vitamin D deficiency. She is currently stable on vit D, not yet at goal. She admits fatigue and denies nausea, vomiting or muscle weakness.  ALLERGIES: Allergies  Allergen Reactions  . Peanut-Containing Drug Products Swelling and Rash    MEDICATIONS: Current Outpatient Prescriptions on File Prior to Visit  Medication Sig Dispense Refill  . hydrochlorothiazide (MICROZIDE) 12.5 MG capsule Take 1 capsule (12.5 mg total) by mouth daily. 30 capsule 0  . medroxyPROGESTERone (DEPO-PROVERA) 150 MG/ML injection Inject 150 mg into the muscle every 3 (three) months.    . metFORMIN (GLUCOPHAGE) 500 MG tablet Take 1 tablet (500 mg total) by mouth daily with breakfast. 30 tablet 0   No current facility-administered medications on file prior to visit.     PAST MEDICAL HISTORY: Past Medical History:  Diagnosis Date  . Allergy   . Chest pain   . Constipation   . Gallbladder problem   . GERD (gastroesophageal reflux disease)   . Kidney problem     PAST SURGICAL HISTORY: Past Surgical History:  Procedure Laterality Date  . CESAREAN SECTION      SOCIAL HISTORY: Social History  Substance Use Topics  . Smoking status: Never Smoker  . Smokeless tobacco: Never Used  . Alcohol use No    FAMILY HISTORY: Family History  Problem  Relation Age of Onset  . Hypertension Mother   . Hyperlipidemia Mother   . Sudden death Mother   . Kidney disease Mother   . Obesity Mother     ROS: Review of Systems  Constitutional: Positive for malaise/fatigue. Negative for weight loss.  Gastrointestinal: Negative for nausea and vomiting.  Musculoskeletal:       Negative muscle weakness    PHYSICAL EXAM: Blood pressure 106/74, pulse 88, temperature 98.3 F (36.8 C), temperature source Oral, height 5' (1.524 m), weight 174 lb (78.9 kg), SpO2 100 %. Body mass index is 33.98 kg/m. Physical Exam  Constitutional: She is oriented to person, place, and time. She appears well-developed and well-nourished.  Cardiovascular: Normal rate.   Pulmonary/Chest: Effort normal.  Musculoskeletal: Normal range of motion.  Neurological: She is oriented to person, place, and time.  Skin: Skin is warm and dry.  Psychiatric: She has a normal mood and affect. Her behavior is normal.  Vitals reviewed.   RECENT LABS AND TESTS: BMET    Component Value Date/Time   NA 139 09/25/2016 0940   K 4.6 09/25/2016 0940   CL 103 09/25/2016 0940   CO2 22 09/25/2016 0940   GLUCOSE 101 (H) 09/25/2016 0940   GLUCOSE 121 (H) 09/18/2014 1808   BUN 10 09/25/2016 0940   CREATININE 0.68 09/25/2016 0940   CALCIUM 9.3 09/25/2016 0940   GFRNONAA 102 09/25/2016 0940   GFRAA 118 09/25/2016 0940   Lab Results  Component Value Date   HGBA1C 5.8 (H) 09/25/2016   Lab Results  Component Value Date   INSULIN 17.5 09/25/2016   CBC    Component Value Date/Time   WBC 8.7 09/25/2016 0940   WBC 15.2 (H) 09/18/2014 1808   RBC 5.01 09/25/2016 0940   RBC 5.15 (H) 09/18/2014 1808   HGB 13.3 09/25/2016 0940   HCT 41.4 09/25/2016 0940   PLT 324 09/25/2016 0940   MCV 83 09/25/2016 0940   MCH 26.5 (L) 09/25/2016 0940   MCH 27.4 09/18/2014 1808   MCHC 32.1 09/25/2016 0940   MCHC 33.4 09/18/2014 1808   RDW 14.5 09/25/2016 0940   LYMPHSABS 2.8 09/25/2016 0940    MONOABS 0.9 07/06/2012 1403   EOSABS 0.1 09/25/2016 0940   BASOSABS 0.0 09/25/2016 0940   Iron/TIBC/Ferritin/ %Sat No results found for: IRON, TIBC, FERRITIN, IRONPCTSAT Lipid Panel     Component Value Date/Time   CHOL 192 09/25/2016 0940   TRIG 74 09/25/2016 0940   HDL 44 09/25/2016 0940   LDLCALC 133 (H) 09/25/2016 0940   Hepatic Function Panel     Component Value Date/Time   PROT 7.4 09/25/2016 0940   ALBUMIN 4.1 09/25/2016 0940   AST 13 09/25/2016 0940   ALT 8 09/25/2016 0940   ALKPHOS 76 09/25/2016 0940   BILITOT 0.4 09/25/2016 0940   BILIDIR 0.5 09/18/2014 2024   IBILI 0.9 09/18/2014 2024      Component Value Date/Time   TSH 1.460 09/25/2016 0940    ASSESSMENT AND PLAN: Vitamin D deficiency - Plan: Vitamin D, Ergocalciferol, (DRISDOL) 50000 units CAPS capsule  Class 1 obesity with serious comorbidity and body mass index (BMI) of 34.0 to 34.9 in adult, unspecified obesity type  PLAN:  Vitamin D Deficiency Kristy Cook was informed that low vitamin D levels contributes to fatigue and are associated with obesity, breast, and colon cancer. She agrees to continue to take prescription Vit D @50 ,000 IU every week, we will refoill for 1 month and will re-check labs in 2 weeks and will follow up for routine testing of vitamin D, at least 2-3 times per year. She was informed of the risk of over-replacement of vitamin D and agrees to not increase her dose unless he discusses this with [place] first. Kristy Cook agrees to follow up with our clinic in 2 weeks.   Obesity Karmon is currently in the action stage of change. As such, her goal is to continue with weight loss efforts She has agreed to follow the Category 2 plan Kristy Cook has been instructed to work up to a goal of 150 minutes of combined cardio and strengthening exercise per week for weight loss and overall health benefits. We discussed the following Behavioral Modification Strategies today: increasing lean protein intake, decrease  eating out and ways to avoid boredom eating  Kristy Cook has agreed to follow up with our clinic in 2 weeks. She was informed of the importance of frequent follow up visits to maximize her success with intensive lifestyle modifications for her multiple health conditions.  I, Doreene Nest, am acting as transcriptionist for Dennard Nip, MD  I have reviewed the above documentation for accuracy and completeness, and I agree with the above. -Dennard Nip, MD   OBESITY BEHAVIORAL INTERVENTION VISIT  Today's visit was # 6 out of 22.  Starting weight: 177 lbs Starting date: 09/25/16 Today's weight : 174 lbs Today's date: 01/02/2017 Total lbs lost to date: 3 (Patients must lose 7 lbs in the first 6 months to  continue with counseling)   ASK: We discussed the diagnosis of obesity with [place] today and Falynn agreed to give [place] permission to discuss obesity behavioral modification therapy today.  ASSESS: Chizara has the diagnosis of obesity and her BMI today is 34.1 Keisy is in the action stage of change   ADVISE: Emmely was educated on the multiple health risks of obesity as well as the benefit of weight loss to improve her health. She was advised of the need for long term treatment and the importance of lifestyle modifications.  AGREE: Multiple dietary modification options and treatment options were discussed and  Elycia agreed to follow the Category 2 plan We discussed the following Behavioral Modification Strategies today: increasing lean protein intake, decrease eating out and ways to avoid boredom eating

## 2017-01-16 ENCOUNTER — Ambulatory Visit (INDEPENDENT_AMBULATORY_CARE_PROVIDER_SITE_OTHER): Payer: 59 | Admitting: Family Medicine

## 2017-01-16 VITALS — BP 143/93 | HR 84 | Temp 98.2°F | Ht 60.0 in | Wt 175.0 lb

## 2017-01-16 DIAGNOSIS — R7303 Prediabetes: Secondary | ICD-10-CM | POA: Diagnosis not present

## 2017-01-16 DIAGNOSIS — E784 Other hyperlipidemia: Secondary | ICD-10-CM

## 2017-01-16 DIAGNOSIS — I10 Essential (primary) hypertension: Secondary | ICD-10-CM | POA: Diagnosis not present

## 2017-01-16 DIAGNOSIS — Z6834 Body mass index (BMI) 34.0-34.9, adult: Secondary | ICD-10-CM

## 2017-01-16 DIAGNOSIS — E669 Obesity, unspecified: Secondary | ICD-10-CM

## 2017-01-16 DIAGNOSIS — E559 Vitamin D deficiency, unspecified: Secondary | ICD-10-CM

## 2017-01-16 DIAGNOSIS — Z9189 Other specified personal risk factors, not elsewhere classified: Secondary | ICD-10-CM

## 2017-01-16 DIAGNOSIS — E7849 Other hyperlipidemia: Secondary | ICD-10-CM

## 2017-01-16 MED ORDER — VITAMIN D (ERGOCALCIFEROL) 1.25 MG (50000 UNIT) PO CAPS: 50000.0000 [IU] | ORAL_CAPSULE | ORAL | 0 refills | Status: DC

## 2017-01-16 NOTE — Progress Notes (Signed)
Office: [phone]  /  Fax: [phone]   HPI:   Chief Complaint: OBESITY Kristy Cook is here to discuss her progress with her obesity treatment plan. She is on the  follow the Category 2 plan and is following her eating plan approximately 50 % of the time. She states she is exercising 0 minutes 0 times per week. Basia has been [employer] and controlling her portions. She had some challenges with planning ahead and is now ready to get back on track. Her weight is 175 lb (79.4 kg) today and has had a weight gain of 1 pound over a period of 2 weeks since her last visit. She has lost 2 lbs since starting treatment with [place].  Vitamin D deficiency Kristy Cook has a diagnosis of vitamin D deficiency. She is currently taking vit D and denies nausea, vomiting or muscle weakness.  Hypertension Kristy Cook is a 51 y.o. female with hypertension. Her blood pressure today was at 143/93 and Kristy Cook denies chest pain or shortness of breath on exertion. She is working weight loss to help control her blood pressure with the goal of decreasing her risk of heart attack and stroke. Roneisas blood pressure is not currently controlled.  Hyperlipidemia Kristy Cook has hyperlipidemia and has been trying to improve her cholesterol levels with intensive lifestyle modification including a low saturated fat diet, exercise and weight loss. She denies any chest pain, claudication or myalgias.  Pre-Diabetes Kristy Cook has a diagnosis of pre-diabetes based on her elevated Hgb A1c and was informed this puts her at greater risk of developing diabetes. She is taking metformin currently and continues to work on diet and exercise to decrease risk of diabetes. She denies nausea, polyphagia or hypoglycemia.  At risk for diabetes Kristy Cook is at higher than average risk for developing diabetes due to her obesity. She currently denies polyuria or polydipsia.   ALLERGIES: Allergies  Allergen Reactions    Peanut-Containing Drug Products Swelling and Rash    MEDICATIONS: Current Outpatient Prescriptions on File Prior to Visit  Medication Sig Dispense Refill   hydrochlorothiazide (MICROZIDE) 12.5 MG capsule Take 1 capsule (12.5 mg total) by mouth daily. 30 capsule 0   medroxyPROGESTERone (DEPO-PROVERA) 150 MG/ML injection Inject 150 mg into the muscle every 3 (three) months.     metFORMIN (GLUCOPHAGE) 500 MG tablet Take 1 tablet (500 mg total) by mouth daily with breakfast. 30 tablet 0   No current facility-administered medications on file prior to visit.     PAST MEDICAL HISTORY: Past Medical History:  Diagnosis Date   Allergy    Chest pain    Constipation    Gallbladder problem    GERD (gastroesophageal reflux disease)    Kidney problem     PAST SURGICAL HISTORY: Past Surgical History:  Procedure Laterality Date   CESAREAN SECTION      SOCIAL HISTORY: Social History  Substance Use Topics   Smoking status: Never Smoker   Smokeless tobacco: Never Used   Alcohol use No    FAMILY HISTORY: Family History  Problem Relation Age of Onset   Hypertension Mother    Hyperlipidemia Mother    Sudden death Mother    Kidney disease Mother    Obesity Mother     ROS: Review of Systems  Constitutional: Negative for weight loss.  Respiratory: Negative for shortness of breath (on exertion).   Cardiovascular: Negative for chest pain and claudication.  Gastrointestinal: Negative for nausea and vomiting.  Genitourinary: Negative for frequency.  Musculoskeletal: Negative  for myalgias.       Negative muscle weakness  Endo/Heme/Allergies: Negative for polydipsia.       Negative polyphagia Negative hypoglycemia    PHYSICAL EXAM: Blood pressure (!) 143/93, pulse 84, temperature 98.2 F (36.8 C), temperature source Oral, height 5' (1.524 m), weight 175 lb (79.4 kg), SpO2 100 %. Body mass index is 34.18 kg/m. Physical Exam  Constitutional: She is oriented to  person, place, and time. She appears well-developed and well-nourished.  Cardiovascular: Normal rate.   Pulmonary/Chest: Effort normal.  Musculoskeletal: Normal range of motion.  Neurological: She is oriented to person, place, and time.  Skin: Skin is warm and dry.  Psychiatric: She has a normal mood and affect. Her behavior is normal.  Vitals reviewed.   RECENT LABS AND TESTS: BMET    Component Value Date/Time   NA 139 09/25/2016 0940   K 4.6 09/25/2016 0940   CL 103 09/25/2016 0940   CO2 22 09/25/2016 0940   GLUCOSE 101 (H) 09/25/2016 0940   GLUCOSE 121 (H) 09/18/2014 1808   BUN 10 09/25/2016 0940   CREATININE 0.68 09/25/2016 0940   CALCIUM 9.3 09/25/2016 0940   GFRNONAA 102 09/25/2016 0940   GFRAA 118 09/25/2016 0940   Lab Results  Component Value Date   HGBA1C 5.8 (H) 09/25/2016   Lab Results  Component Value Date   INSULIN 17.5 09/25/2016   CBC    Component Value Date/Time   WBC 8.7 09/25/2016 0940   WBC 15.2 (H) 09/18/2014 1808   RBC 5.01 09/25/2016 0940   RBC 5.15 (H) 09/18/2014 1808   HGB 13.3 09/25/2016 0940   HCT 41.4 09/25/2016 0940   PLT 324 09/25/2016 0940   MCV 83 09/25/2016 0940   MCH 26.5 (L) 09/25/2016 0940   MCH 27.4 09/18/2014 1808   MCHC 32.1 09/25/2016 0940   MCHC 33.4 09/18/2014 1808   RDW 14.5 09/25/2016 0940   LYMPHSABS 2.8 09/25/2016 0940   MONOABS 0.9 07/06/2012 1403   EOSABS 0.1 09/25/2016 0940   BASOSABS 0.0 09/25/2016 0940   Iron/TIBC/Ferritin/ %Sat No results found for: IRON, TIBC, FERRITIN, IRONPCTSAT Lipid Panel     Component Value Date/Time   CHOL 192 09/25/2016 0940   TRIG 74 09/25/2016 0940   HDL 44 09/25/2016 0940   LDLCALC 133 (H) 09/25/2016 0940   Hepatic Function Panel     Component Value Date/Time   PROT 7.4 09/25/2016 0940   ALBUMIN 4.1 09/25/2016 0940   AST 13 09/25/2016 0940   ALT 8 09/25/2016 0940   ALKPHOS 76 09/25/2016 0940   BILITOT 0.4 09/25/2016 0940   BILIDIR 0.5 09/18/2014 2024   IBILI 0.9  09/18/2014 2024      Component Value Date/Time   TSH 1.460 09/25/2016 0940    ASSESSMENT AND PLAN: Essential hypertension  Prediabetes - Plan: Comprehensive metabolic panel, Hemoglobin A1c, Insulin, random  Other hyperlipidemia - Plan: Lipid Panel With LDL/HDL Ratio  Vitamin D deficiency - Plan: VITAMIN D 25 Hydroxy (Vit-D Deficiency, Fractures), Vitamin D, Ergocalciferol, (DRISDOL) 50000 units CAPS capsule  At risk for diabetes mellitus  Class 1 obesity with serious comorbidity and body mass index (BMI) of 34.0 to 34.9 in adult, unspecified obesity type  PLAN:  Vitamin D Deficiency Kristy Cook was informed that low vitamin D levels contributes to fatigue and are associated with obesity, breast, and colon cancer. She agrees to continue to take prescription Vit D @50 ,000 IU every week, we will refill for 1 month and will follow up for routine  testing of vitamin D, at least 2-3 times per year. She was informed of the risk of over-replacement of vitamin D and agrees to not increase her dose unless he discusses this with [place] first. Kristy Cook agrees to follow up with our clinic in 3 weeks.  Hypertension We discussed sodium restriction, working on healthy weight loss, and a regular exercise program as the means to achieve improved blood pressure control. Kristy Cook agreed with this plan and agreed to follow up as directed. We will check labs and will continue to monitor her blood pressure as well as her progress with the above lifestyle modifications. She will continue her medications as prescribed and will watch for signs of hypotension as she continues her lifestyle modifications.  Hyperlipidemia Kristy Cook was informed of the American Heart Association Guidelines emphasizing intensive lifestyle modifications as the first line treatment for hyperlipidemia. We discussed many lifestyle modifications today in depth, and Kristy Cook will continue to work on decreasing saturated fats such as fatty red meat,  butter and many fried foods. She will also increase vegetables and lean protein in her diet and continue to work on exercise and weight loss efforts. We will check labs and Kristy Cook agrees to follow up as directed.  Pre-Diabetes Kristy Cook will continue to work on weight loss, exercise, and decreasing simple carbohydrates in her diet to help decrease the risk of diabetes. We dicussed metformin including benefits and risks. She was informed that eating too many simple carbohydrates or too many calories at one sitting increases the likelihood of GI side effects. Kristy Cook agrees to continue metformin for now and a prescription was not written today. We will check labs and Kristy Cook agreed to follow up with [place] as directed to monitor her progress.  Diabetes risk counselling Kristy Cook was given extended (15 minutes) diabetes prevention counseling today. She is 51 y.o. female and has risk factors for diabetes including obesity. We discussed intensive lifestyle modifications today with an emphasis on weight loss as well as increasing exercise and decreasing simple carbohydrates in her diet.  Obesity Kristy Cook is currently in the action stage of change. As such, her goal is to continue with weight loss efforts She has agreed to follow the Category 2 plan Kristy Cook has been instructed to work up to a goal of 150 minutes of combined cardio and strengthening exercise per week for weight loss and overall health benefits. We discussed the following Behavioral Modification Strategies today: increasing lean protein intake and keeping healthy foods in the home  Kristy Cook has agreed to follow up with our clinic in 3 weeks. She was informed of the importance of frequent follow up visits to maximize her success with intensive lifestyle modifications for her multiple health conditions.  I, Kristy Cook, am acting as transcriptionist for Kristy Duverney, PA-C  I have reviewed the above documentation for accuracy and completeness, and I  agree with the above. -Kristy Duverney, PA-C  I have reviewed the above note and agree with the plan. -Dennard Nip, MD   OBESITY BEHAVIORAL INTERVENTION VISIT  Today's visit was # 7 out of 22.  Starting weight: 177 lbs Starting date: 09/25/16 Today's weight : 175 lbs Today's date: 01/16/2017 Total lbs lost to date: 2 (Patients must lose 7 lbs in the first 6 months to continue with counseling)   ASK: We discussed the diagnosis of obesity with [place] today and Carola agreed to give [place] permission to discuss obesity behavioral modification therapy today.  ASSESS: Azora has the diagnosis of obesity and her BMI today  is 34.2 Janylah is in the action stage of change   ADVISE: Elinda was educated on the multiple health risks of obesity as well as the benefit of weight loss to improve her health. She was advised of the need for long term treatment and the importance of lifestyle modifications.  AGREE: Multiple dietary modification options and treatment options were discussed and  Analeigha agreed to follow the Category 2 plan We discussed the following Behavioral Modification Strategies today: increasing lean protein intake and keeping healthy foods in the home

## 2017-01-17 LAB — COMPREHENSIVE METABOLIC PANEL
A/G RATIO: 1.3 (ref 1.2–2.2)
ALT: 8 IU/L (ref 0–32)
AST: 8 IU/L (ref 0–40)
Albumin: 4.2 g/dL (ref 3.5–5.5)
Alkaline Phosphatase: 76 IU/L (ref 39–117)
BILIRUBIN TOTAL: 0.4 mg/dL (ref 0.0–1.2)
BUN/Creatinine Ratio: 16 (ref 9–23)
BUN: 10 mg/dL (ref 6–24)
CHLORIDE: 103 mmol/L (ref 96–106)
CO2: 25 mmol/L (ref 20–29)
Calcium: 9.6 mg/dL (ref 8.7–10.2)
Creatinine, Ser: 0.63 mg/dL (ref 0.57–1.00)
GFR calc Af Amer: 120 mL/min/{1.73_m2} (ref >59)
GFR, EST NON AFRICAN AMERICAN: 104 mL/min/{1.73_m2} (ref >59)
GLUCOSE: 92 mg/dL (ref 65–99)
Globulin, Total: 3.2 g/dL (ref 1.5–4.5)
Potassium: 4.3 mmol/L (ref 3.5–5.2)
SODIUM: 141 mmol/L (ref 134–144)
Total Protein: 7.4 g/dL (ref 6.0–8.5)

## 2017-01-17 LAB — LIPID PANEL WITH LDL/HDL RATIO
CHOLESTEROL TOTAL: 197 mg/dL (ref 100–199)
HDL: 49 mg/dL (ref >39)
LDL Calculated: 135 mg/dL — ABNORMAL HIGH (ref 0–99)
LDl/HDL Ratio: 2.8 ratio (ref 0.0–3.2)
Triglycerides: 66 mg/dL (ref 0–149)
VLDL Cholesterol Cal: 13 mg/dL (ref 5–40)

## 2017-01-17 LAB — INSULIN, RANDOM: INSULIN: 15 u[IU]/mL (ref 2.6–24.9)

## 2017-01-17 LAB — HEMOGLOBIN A1C
ESTIMATED AVERAGE GLUCOSE: 120 mg/dL
HEMOGLOBIN A1C: 5.8 % — AB (ref 4.8–5.6)

## 2017-01-17 LAB — VITAMIN D 25 HYDROXY (VIT D DEFICIENCY, FRACTURES): Vit D, 25-Hydroxy: 28.2 ng/mL — ABNORMAL LOW (ref 30.0–100.0)

## 2017-01-24 IMAGING — CR DG CHEST 2V
2 series · 2 of 2 positions shown · non-contrast
Comparison: 07/06/2012

CLINICAL DATA: Chest pain her on the sternal area radiating to the
pack.

EXAM:
CHEST  2 VIEW

[w chest pa]
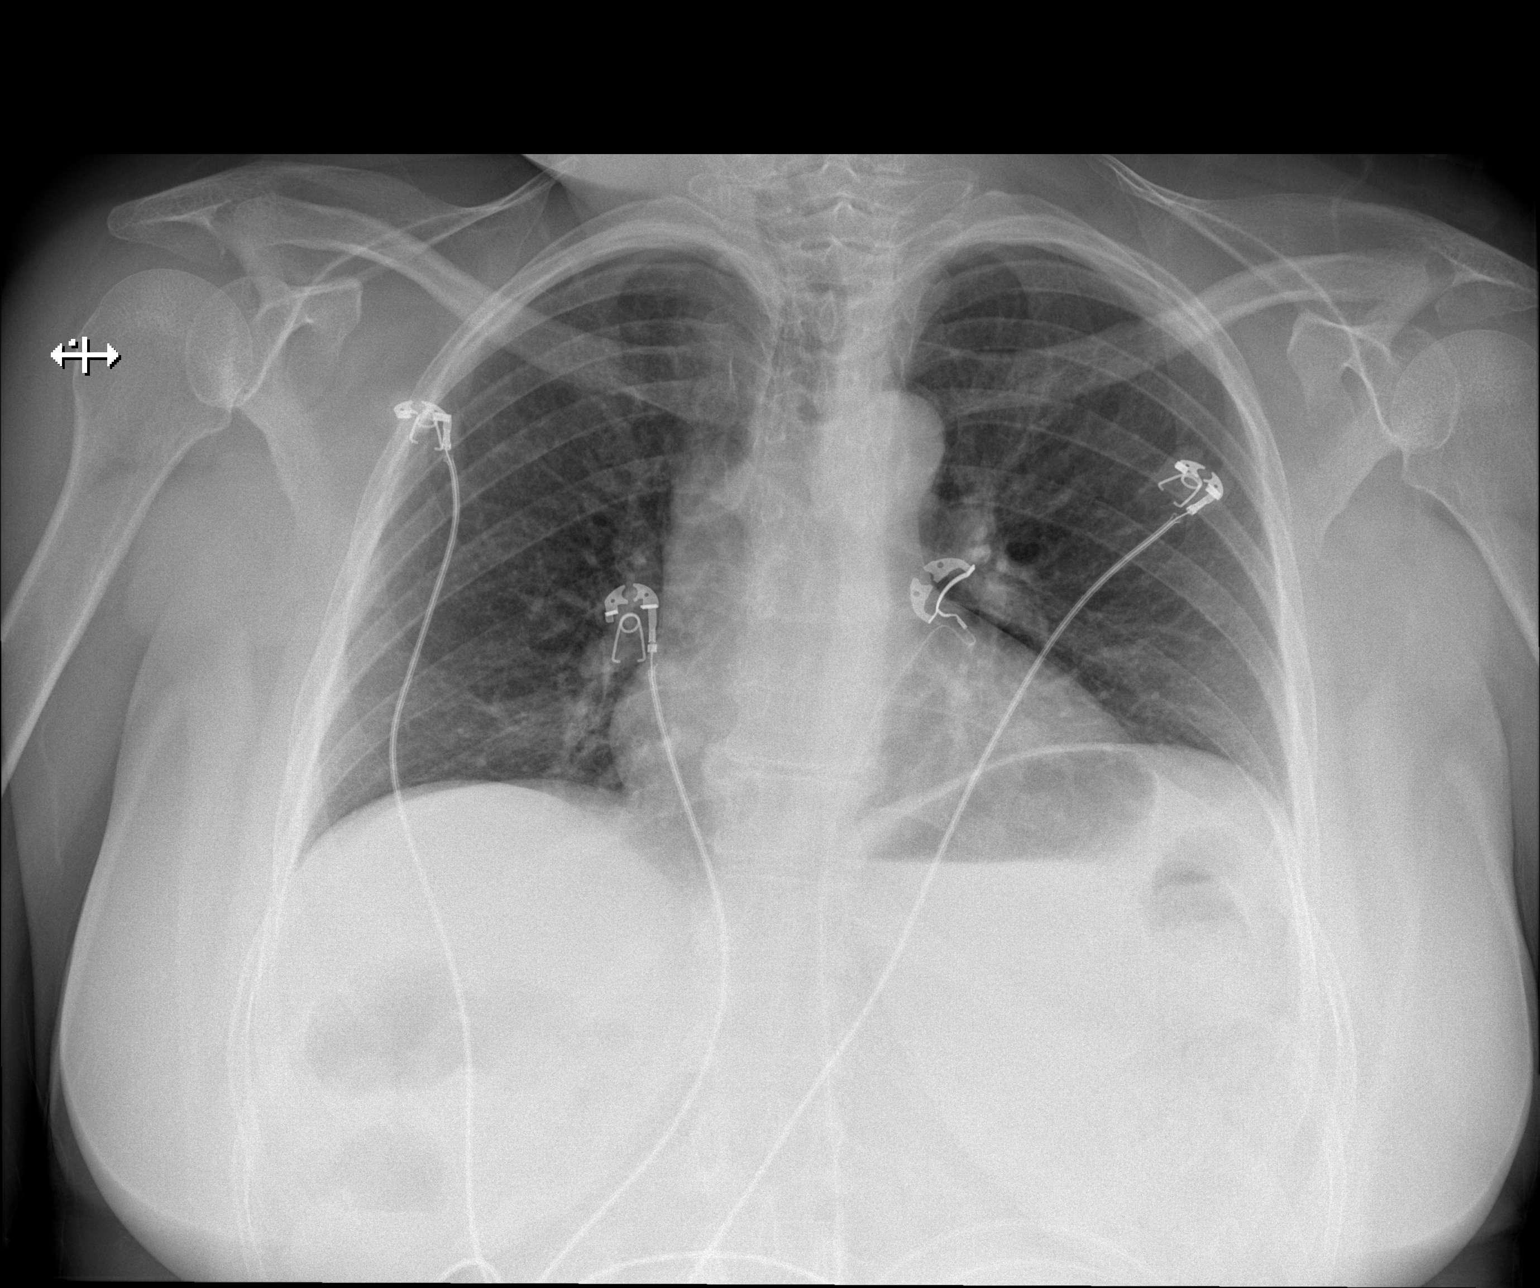

[w chest lat]
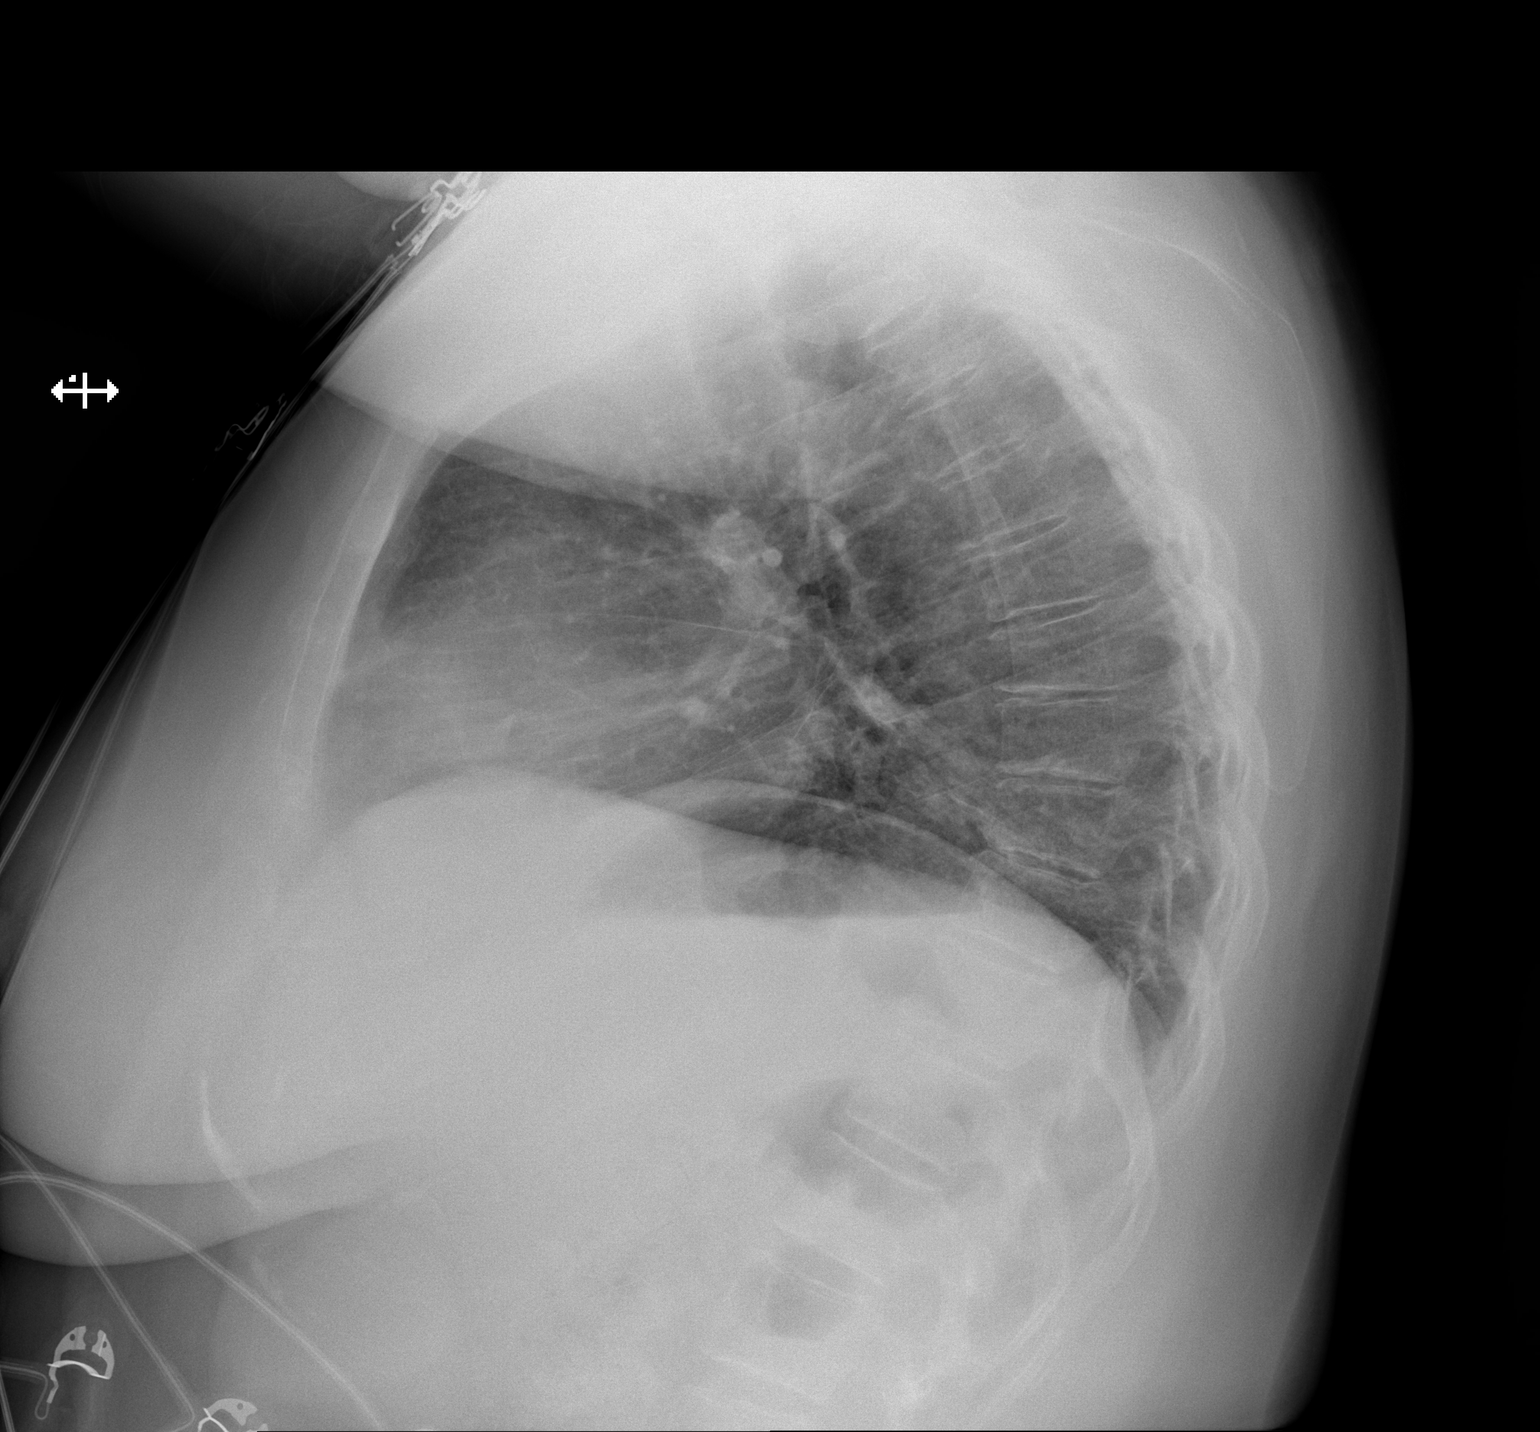

[2 of 2 positions shown; findings below may reference images not displayed]

FINDINGS: Multiple monitoring leads overlie the patient. Stable cardiac and
mediastinal contours. Minimal bilateral lower lung linear opacities.
No large consolidative pulmonary opacity. No pleural effusion or
pneumothorax. Mid thoracic spine degenerative changes.
IMPRESSION: Minimal dependent atelectasis bilateral lung bases.

## 2017-01-24 IMAGING — US US ABDOMEN LIMITED
1 series · 14 of 25 positions shown · non-contrast
Comparison: Ultrasound 07/09/2012

CLINICAL DATA: Right upper quadrant abdominal pain.

EXAM:
US ABDOMEN LIMITED - RIGHT UPPER QUADRANT

[Series 1: us abdomen limited · 0.20mm/px · 14 of 30 slices shown]
[im 1/30]
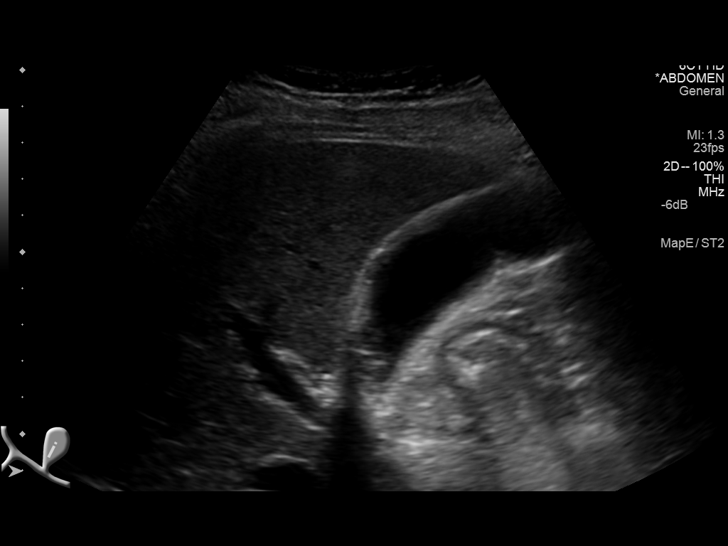
[im 3/30]
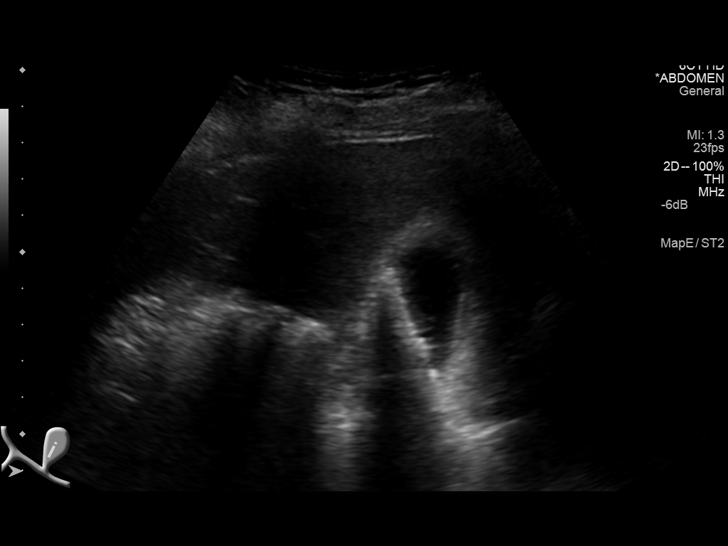
[im 5/30]
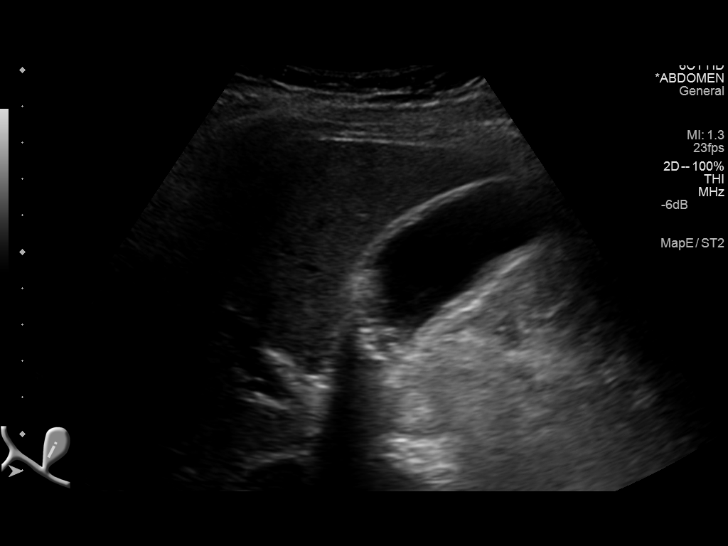
[im 8/30]
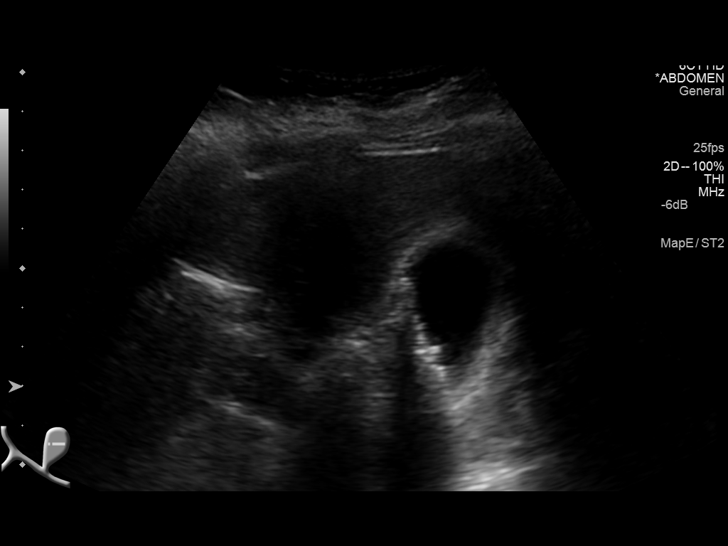
[im 10/30]
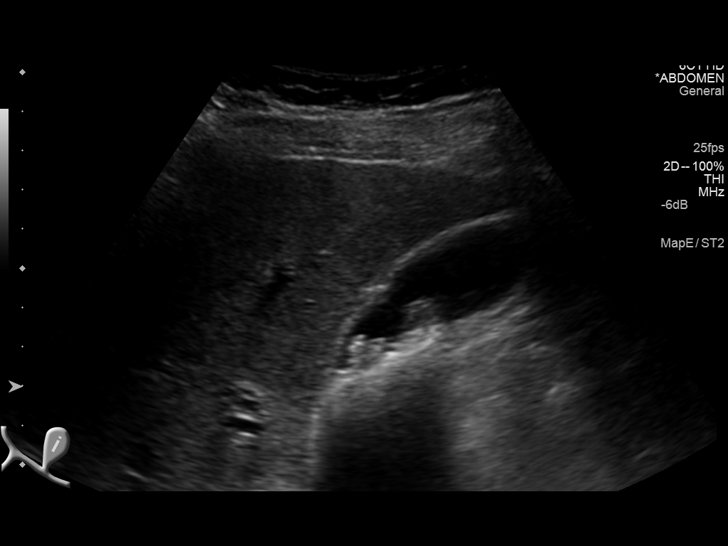
[im 11/30]
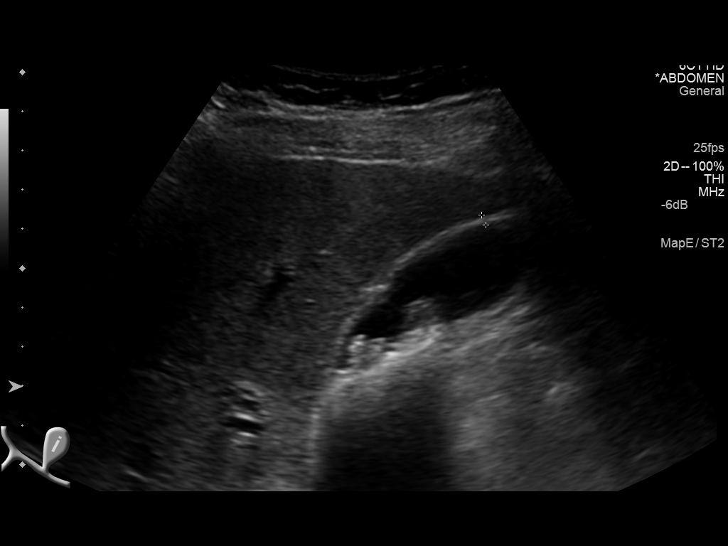
[im 14/30]
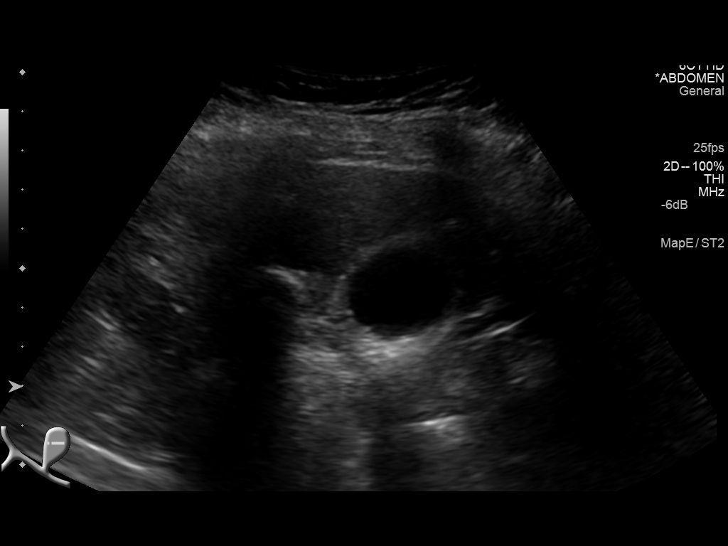
[im 16/30]
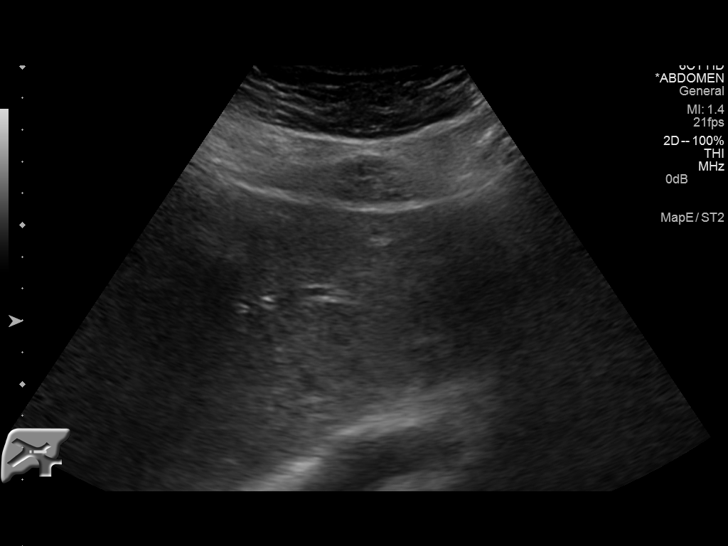
[im 19/30]
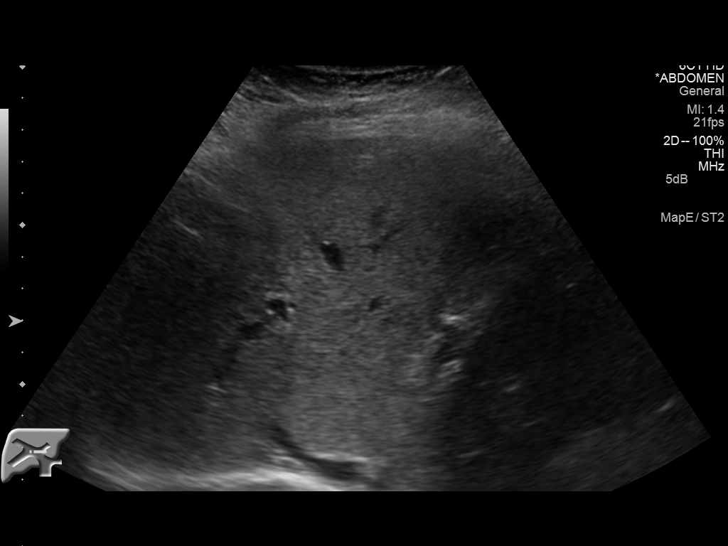
[im 20/30]
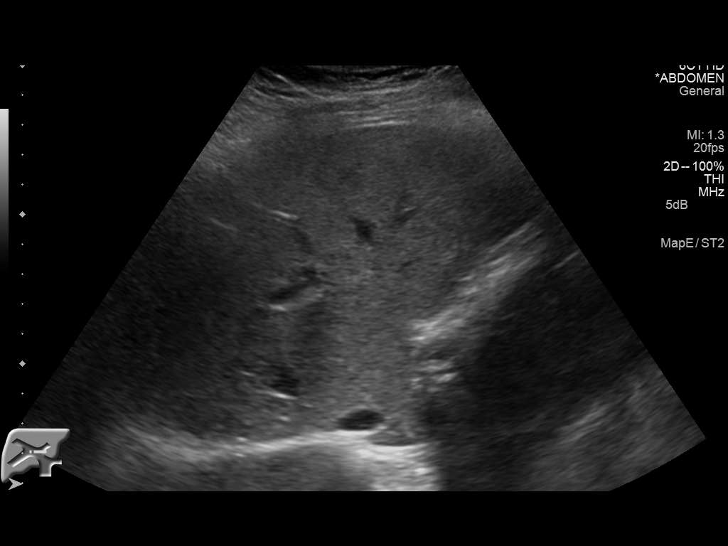
[im 22/30]
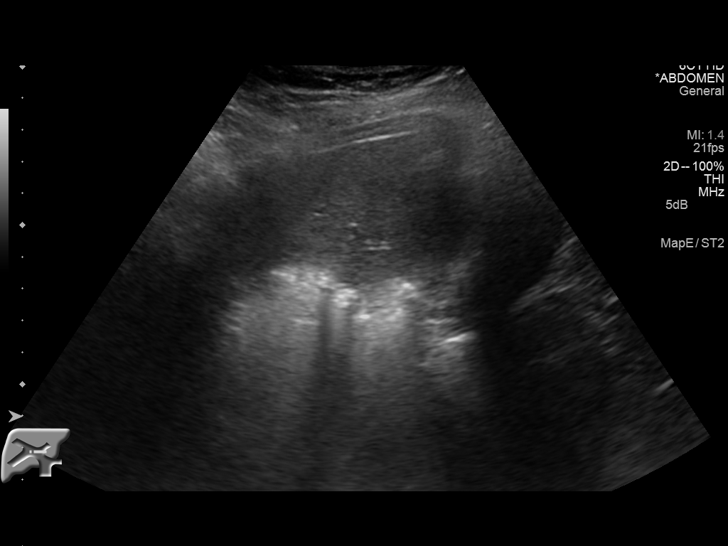
[im 25/30]
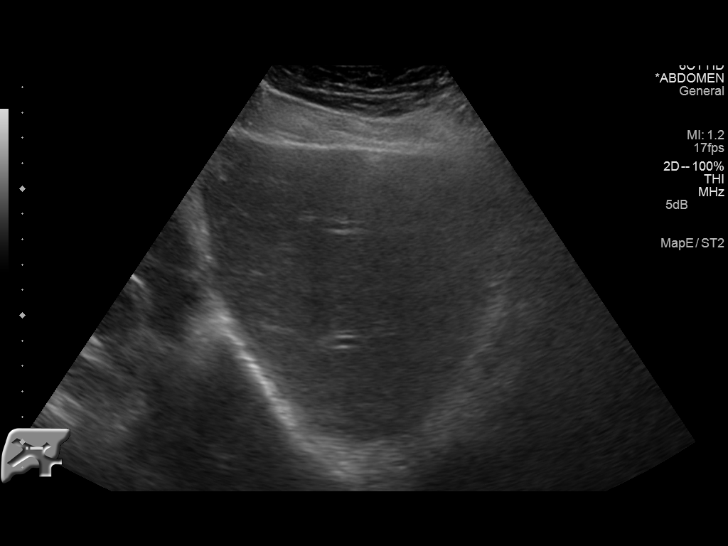
[im 27/30]
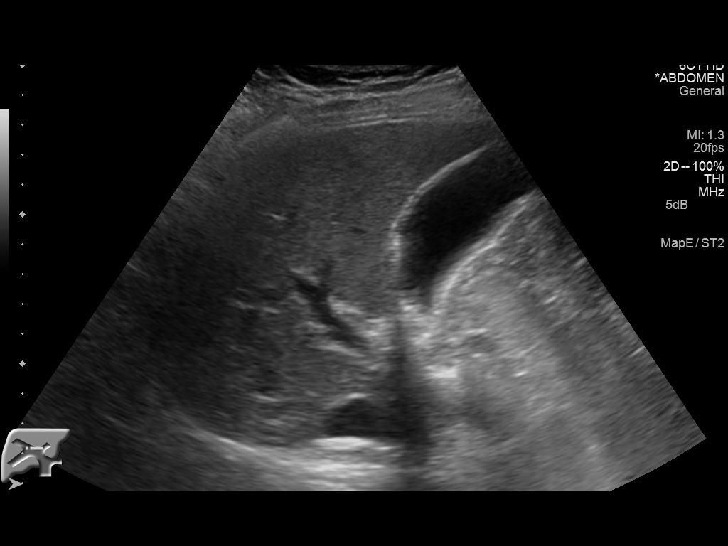
[im 30/30]
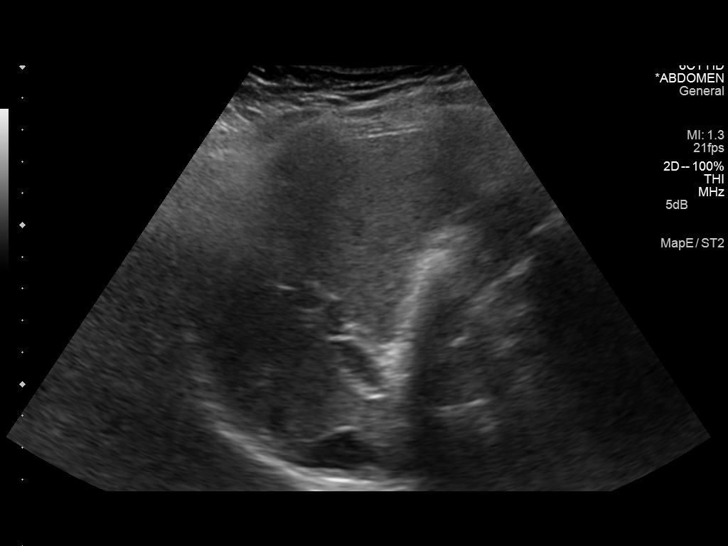

[14 of 25 positions shown; findings below may reference images not displayed]

FINDINGS: Gallbladder:

Physiologically distended. Multiple layering shadowing stones and
minimal sludge. No gallbladder wall thickening, wall thickness of
2.8 mm. No sonographic Murphy sign noted.

Common bile duct:

Diameter: 2.7 mm, normal

Liver:

No focal lesion identified. Within normal limits in parenchymal
echogenicity.
IMPRESSION: Cholelithiasis.  No cholecystitis or biliary dilatation.

## 2017-01-30 ENCOUNTER — Encounter (INDEPENDENT_AMBULATORY_CARE_PROVIDER_SITE_OTHER): Payer: Self-pay

## 2017-01-30 ENCOUNTER — Other Ambulatory Visit (INDEPENDENT_AMBULATORY_CARE_PROVIDER_SITE_OTHER): Payer: Self-pay

## 2017-01-30 DIAGNOSIS — I10 Essential (primary) hypertension: Secondary | ICD-10-CM

## 2017-01-30 MED ORDER — HYDROCHLOROTHIAZIDE 12.5 MG PO CAPS: 12.5000 mg | ORAL_CAPSULE | Freq: Every day | ORAL | 0 refills | Status: DC

## 2017-02-06 ENCOUNTER — Ambulatory Visit (INDEPENDENT_AMBULATORY_CARE_PROVIDER_SITE_OTHER): Payer: 59 | Admitting: Physician Assistant

## 2017-02-06 ENCOUNTER — Encounter (INDEPENDENT_AMBULATORY_CARE_PROVIDER_SITE_OTHER): Payer: Self-pay

## 2017-03-20 DIAGNOSIS — Z1211 Encounter for screening for malignant neoplasm of colon: Secondary | ICD-10-CM | POA: Diagnosis not present

## 2017-03-20 DIAGNOSIS — K8020 Calculus of gallbladder without cholecystitis without obstruction: Secondary | ICD-10-CM | POA: Diagnosis not present

## 2017-04-07 DIAGNOSIS — T23271A Burn of second degree of right wrist, initial encounter: Secondary | ICD-10-CM | POA: Diagnosis not present

## 2017-04-07 DIAGNOSIS — L308 Other specified dermatitis: Secondary | ICD-10-CM | POA: Diagnosis not present

## 2017-04-09 DIAGNOSIS — L309 Dermatitis, unspecified: Secondary | ICD-10-CM | POA: Diagnosis not present

## 2017-04-25 DIAGNOSIS — L739 Follicular disorder, unspecified: Secondary | ICD-10-CM | POA: Diagnosis not present

## 2017-04-25 DIAGNOSIS — L309 Dermatitis, unspecified: Secondary | ICD-10-CM | POA: Diagnosis not present

## 2017-05-08 DIAGNOSIS — L299 Pruritus, unspecified: Secondary | ICD-10-CM | POA: Diagnosis not present

## 2017-05-08 DIAGNOSIS — L280 Lichen simplex chronicus: Secondary | ICD-10-CM | POA: Diagnosis not present

## 2017-06-26 DIAGNOSIS — L309 Dermatitis, unspecified: Secondary | ICD-10-CM | POA: Diagnosis not present

## 2017-06-26 DIAGNOSIS — L739 Follicular disorder, unspecified: Secondary | ICD-10-CM | POA: Diagnosis not present

## 2017-08-05 ENCOUNTER — Ambulatory Visit: Payer: 59 | Admitting: Allergy and Immunology

## 2017-09-10 DIAGNOSIS — Z01419 Encounter for gynecological examination (general) (routine) without abnormal findings: Secondary | ICD-10-CM | POA: Diagnosis not present

## 2017-09-10 DIAGNOSIS — Z1231 Encounter for screening mammogram for malignant neoplasm of breast: Secondary | ICD-10-CM | POA: Diagnosis not present

## 2017-09-10 DIAGNOSIS — Z124 Encounter for screening for malignant neoplasm of cervix: Secondary | ICD-10-CM | POA: Diagnosis not present

## 2018-03-19 DIAGNOSIS — L280 Lichen simplex chronicus: Secondary | ICD-10-CM | POA: Diagnosis not present

## 2018-06-09 ENCOUNTER — Ambulatory Visit: Payer: 59 | Admitting: Family Medicine

## 2018-06-09 ENCOUNTER — Encounter: Payer: Self-pay | Admitting: Family Medicine

## 2018-06-09 VITALS — BP 158/92 | HR 97 | Temp 98.1°F | Ht 59.5 in | Wt 182.8 lb

## 2018-06-09 DIAGNOSIS — Z1211 Encounter for screening for malignant neoplasm of colon: Secondary | ICD-10-CM

## 2018-06-09 DIAGNOSIS — I10 Essential (primary) hypertension: Secondary | ICD-10-CM

## 2018-06-09 DIAGNOSIS — N912 Amenorrhea, unspecified: Secondary | ICD-10-CM

## 2018-06-09 DIAGNOSIS — R079 Chest pain, unspecified: Secondary | ICD-10-CM | POA: Diagnosis not present

## 2018-06-09 DIAGNOSIS — E559 Vitamin D deficiency, unspecified: Secondary | ICD-10-CM | POA: Diagnosis not present

## 2018-06-09 DIAGNOSIS — N762 Acute vulvitis: Secondary | ICD-10-CM | POA: Insufficient documentation

## 2018-06-09 DIAGNOSIS — R748 Abnormal levels of other serum enzymes: Secondary | ICD-10-CM | POA: Insufficient documentation

## 2018-06-09 DIAGNOSIS — Z6834 Body mass index (BMI) 34.0-34.9, adult: Secondary | ICD-10-CM | POA: Diagnosis not present

## 2018-06-09 DIAGNOSIS — R7303 Prediabetes: Secondary | ICD-10-CM

## 2018-06-09 DIAGNOSIS — Z23 Encounter for immunization: Secondary | ICD-10-CM | POA: Diagnosis not present

## 2018-06-09 DIAGNOSIS — E6609 Other obesity due to excess calories: Secondary | ICD-10-CM

## 2018-06-09 HISTORY — DX: Abnormal levels of other serum enzymes: R74.8

## 2018-06-09 HISTORY — DX: Acute vulvitis: N76.2

## 2018-06-09 LAB — COMPREHENSIVE METABOLIC PANEL
ALT: 9 U/L (ref 0–35)
AST: 10 U/L (ref 0–37)
Albumin: 4.1 g/dL (ref 3.5–5.2)
Alkaline Phosphatase: 70 U/L (ref 39–117)
BUN: 12 mg/dL (ref 6–23)
CO2: 30 mEq/L (ref 19–32)
Calcium: 9.6 mg/dL (ref 8.4–10.5)
Chloride: 103 mEq/L (ref 96–112)
Creatinine, Ser: 0.7 mg/dL (ref 0.40–1.20)
GFR: 112.79 mL/min (ref >60.00)
GLUCOSE: 111 mg/dL — AB (ref 70–99)
Potassium: 3.8 mEq/L (ref 3.5–5.1)
Sodium: 140 mEq/L (ref 135–145)
Total Bilirubin: 0.6 mg/dL (ref 0.2–1.2)
Total Protein: 7.8 g/dL (ref 6.0–8.3)

## 2018-06-09 LAB — CBC WITH DIFFERENTIAL/PLATELET
Basophils Absolute: 0 10*3/uL (ref 0.0–0.1)
Basophils Relative: 0.5 % (ref 0.0–3.0)
Eosinophils Absolute: 0.1 10*3/uL (ref 0.0–0.7)
Eosinophils Relative: 0.9 % (ref 0.0–5.0)
HCT: 40.7 % (ref 36.0–46.0)
Hemoglobin: 13.2 g/dL (ref 12.0–15.0)
Lymphocytes Relative: 25.9 % (ref 12.0–46.0)
Lymphs Abs: 2.4 10*3/uL (ref 0.7–4.0)
MCHC: 32.5 g/dL (ref 30.0–36.0)
MCV: 81.6 fl (ref 78.0–100.0)
MONOS PCT: 9.9 % (ref 3.0–12.0)
Monocytes Absolute: 0.9 10*3/uL (ref 0.1–1.0)
Neutro Abs: 5.8 10*3/uL (ref 1.4–7.7)
Neutrophils Relative %: 62.8 % (ref 43.0–77.0)
Platelets: 317 10*3/uL (ref 150.0–400.0)
RBC: 4.99 Mil/uL (ref 3.87–5.11)
RDW: 14.4 % (ref 11.5–15.5)
WBC: 9.2 10*3/uL (ref 4.0–10.5)

## 2018-06-09 LAB — FOLLICLE STIMULATING HORMONE: FSH: 66.2 m[IU]/mL

## 2018-06-09 LAB — LIPID PANEL
Cholesterol: 181 mg/dL (ref 0–200)
HDL: 43 mg/dL (ref >39.00)
LDL CALC: 123 mg/dL — AB (ref 0–99)
NonHDL: 137.83
Total CHOL/HDL Ratio: 4
Triglycerides: 75 mg/dL (ref 0.0–149.0)
VLDL: 15 mg/dL (ref 0.0–40.0)

## 2018-06-09 LAB — HEMOGLOBIN A1C: Hgb A1c MFr Bld: 6 % (ref 4.6–6.5)

## 2018-06-09 LAB — VITAMIN D 25 HYDROXY (VIT D DEFICIENCY, FRACTURES): VITD: 12.29 ng/mL — ABNORMAL LOW (ref 30.00–100.00)

## 2018-06-09 LAB — TSH: TSH: 1.83 u[IU]/mL (ref 0.35–4.50)

## 2018-06-09 MED ORDER — VITAMIN D3 1.25 MG (50000 UT) PO TABS: 1.0000 | ORAL_TABLET | ORAL | 3 refills | Status: DC

## 2018-06-09 MED ORDER — HYDROCHLOROTHIAZIDE 12.5 MG PO CAPS: 12.5000 mg | ORAL_CAPSULE | Freq: Every day | ORAL | 3 refills | Status: DC

## 2018-06-09 NOTE — Progress Notes (Signed)
Subjective:    Patient ID: Kristy Cook, female    DOB: March 30, 1966, 52 y.o.   MRN: [id number]  HPI This is a 52 yo female who presents today to establish care. She lives with her son, fiancee. Works at [employer]. Enjoys shopping and watching HGTV.   Has been going to [facility], gyn annually for pap/mammo, going to [facility] as needed.   Last CPE- annual in March Mammo- March 2019 Pap- at gyn Colonoscopy- never, discussed Cologuard Tdap- unsure Flu- declines Eye- regular Dental- regular Exercise- not regular Sleep- good sleep  Chest pain- occasionally has SOB, chest tightness, no relationship to food or activity. Occurs a couple of times a month, lasts about 1 minutes. No accompanying nausea, arm/neck pain, diaphoresis. Has had in the past, related to gallstones. This is a little different. Rarely has while sleeping. Family history of CAD- grandmother died of heart attack at age 52.   HTN- has diagnosis on chart and was previously on HCTZ, prescribed by Dr. Leafy Ro.   Obesity and prediabetes- previously seen by Dr. Dennard Nip. Was on metformin.   Low vitamin D- out of medication and has been off for ? long  ROS- no abdominal pain, no nausea/vomiting, diarrhea or constipation. Periods very irregular. She is not currently using birth control.    Past Medical History:  Diagnosis Date  . Allergy   . Chest pain   . Constipation   . Gallbladder problem   . GERD (gastroesophageal reflux disease)   . Kidney problem    Past Surgical History:  Procedure Laterality Date  . CESAREAN SECTION     Family History  Problem Relation Age of Onset  . Hypertension Mother   . Hyperlipidemia Mother   . Sudden death Mother   . Kidney disease Mother   . Obesity Mother    Social History   Tobacco Use  . Smoking status: Never Smoker  . Smokeless tobacco: Never Used  Substance Use Topics  . Alcohol use: No  . Drug use: No      Review of Systems Per HPI      Objective:   Physical Exam Physical Exam  Constitutional: Oriented to person, place, and time. She appears well-developed and well-nourished.  HENT:  Head: Normocephalic and atraumatic.  Eyes: Conjunctivae are normal.  Neck: Normal range of motion. Neck supple.  Cardiovascular: Normal rate, regular rhythm and normal heart sounds.   Pulmonary/Chest: Effort normal and breath sounds normal.  Musculoskeletal: No edema  Neurological: Alert and oriented to person, place, and time.  Skin: Skin is warm and dry.  Psychiatric: Normal mood and affect. Behavior is normal. Judgment and thought content normal.  Vitals reviewed.     BP (!) 158/92 (BP Location: Left Arm, Patient Position: Sitting, Cuff Size: Large)   Pulse 97   Temp 98.1 F (36.7 C) (Oral)   Ht 4' 11.5" (1.511 m)   Wt 182 lb 12.8 oz (82.9 kg)   LMP 05/02/2018   SpO2 98%   BMI 36.30 kg/m  Wt Readings from Last 3 Encounters:  06/09/18 182 lb 12.8 oz (82.9 kg)  01/16/17 175 lb (79.4 kg)  01/02/17 174 lb (78.9 kg)   BP Readings from Last 3 Encounters:  06/09/18 (!) 158/92  01/16/17 (!) 143/93  01/02/17 106/74   EKG- SR, rate 86, low voltage and questionable old anteroseptal infarct. This was compared with EKG from 09/25/16- no change.    Assessment & Plan:  1. Class 1  obesity due to excess calories without serious comorbidity with body mass index (BMI) of 34.0 to 34.9 in adult - TSH - Lipid Panel - Ambulatory referral to Cardiology  2. Essential hypertension - will get labs and restart meds if normal, follow up in 1 month for pb recheck and labs if needed - Comprehensive metabolic panel - Hemoglobin A1c - CBC with Differential - TSH - Lipid Panel - Ambulatory referral to Cardiology  3. Prediabetes - Comprehensive metabolic panel - Hemoglobin A1c - CBC with Differential - TSH - Lipid Panel - Ambulatory referral to Cardiology  4. Vitamin D deficiency - Vitamin D, 25-hydroxy  5. Amenorrhea - Follicle  stimulating hormone  6. Chest pain, unspecified type - EKG 12-Lead - Ambulatory referral to Cardiology  7. Need for Tdap vaccination - Tdap vaccine greater than or equal to 7yo IM  8. Colon cancer screening - she is concerned about not being able to tolerate colonoscopy prep, will see if her insurance will cover Cologuard (she was given brochure).   [name], FNP-BC  [facility], [facility]  06/09/2018 1:03 PM

## 2018-06-09 NOTE — Patient Instructions (Signed)
Good to see you today  Please stop at the lab for blood work  After your labs come back, will decide what blood pressure medicine to use and whether or not you need something for your blood sugar

## 2018-06-09 NOTE — Addendum Note (Signed)
Addended by: [name] B on: 06/09/2018 03:13 PM   Modules accepted: Orders

## 2018-07-09 ENCOUNTER — Ambulatory Visit: Payer: 59 | Admitting: Family Medicine

## 2018-07-09 ENCOUNTER — Encounter: Payer: Self-pay | Admitting: Family Medicine

## 2018-07-09 VITALS — BP 116/68 | HR 92 | Temp 98.6°F | Ht 59.5 in | Wt 181.0 lb

## 2018-07-09 DIAGNOSIS — I10 Essential (primary) hypertension: Secondary | ICD-10-CM

## 2018-07-09 DIAGNOSIS — E66812 Obesity, class 2: Secondary | ICD-10-CM

## 2018-07-09 DIAGNOSIS — E6609 Other obesity due to excess calories: Secondary | ICD-10-CM | POA: Diagnosis not present

## 2018-07-09 DIAGNOSIS — R079 Chest pain, unspecified: Secondary | ICD-10-CM | POA: Diagnosis not present

## 2018-07-09 DIAGNOSIS — Z6835 Body mass index (BMI) 35.0-35.9, adult: Secondary | ICD-10-CM

## 2018-07-09 DIAGNOSIS — R7303 Prediabetes: Secondary | ICD-10-CM | POA: Diagnosis not present

## 2018-07-09 NOTE — Progress Notes (Signed)
Subjective:    Patient ID: Kristy Cook, female    DOB: 26-Dec-1965, 53 y.o.   MRN: [id number]  HPI This is a 53 yo female who presents today for follow up of obesity, prediabetes, chest pain, HTN.   Obesity/prediabetes- she has lost a pound an a half over last month, has significantly reduced her soda, sweet tea and chocolate intake. Is eating more lean proteins and vegetables. Walking more and has noticed that her exercise tolerance has improved.   Chest pain- has upcoming cardiology appointment in a couple of weeks. She has had fewer episodes of chest pain, usually when having stress at work. None with physical exertion. Has been dealing better with stress.   HTN- tolerating hctz, no dizziness/lighteadedness, no swelling  Past Medical History:  Diagnosis Date  . Allergy   . Chest pain   . Constipation   . Gallbladder problem   . GERD (gastroesophageal reflux disease)   . Kidney problem    Past Surgical History:  Procedure Laterality Date  . CESAREAN SECTION     Family History  Problem Relation Age of Onset  . Hypertension Mother   . Hyperlipidemia Mother   . Sudden death Mother   . Kidney disease Mother   . Obesity Mother    Social History   Tobacco Use  . Smoking status: Never Smoker  . Smokeless tobacco: Never Used  Substance Use Topics  . Alcohol use: No  . Drug use: No      Review of Systems Per HPI    Objective:   Physical Exam Vitals signs reviewed.  Constitutional:      Appearance: Normal appearance. She is obese. She is not ill-appearing.  HENT:     Head: Normocephalic and atraumatic.  Eyes:     Conjunctiva/sclera: Conjunctivae normal.  Cardiovascular:     Rate and Rhythm: Normal rate.  Pulmonary:     Effort: Pulmonary effort is normal.  Skin:    General: Skin is warm and dry.  Neurological:     Mental Status: She is alert and oriented to person, place, and time.  Psychiatric:        Mood and Affect: Mood normal.        Behavior:  Behavior normal.        Thought Content: Thought content normal.        Judgment: Judgment normal.       BP 116/68   Pulse 92   Temp 98.6 F (37 C) (Oral)   Ht 4' 11.5" (1.511 m)   Wt 181 lb (82.1 kg)   LMP 05/06/2018   BMI 35.95 kg/m  Wt Readings from Last 3 Encounters:  07/09/18 181 lb (82.1 kg)  06/09/18 182 lb 12.8 oz (82.9 kg)  01/16/17 175 lb (79.4 kg)   BP Readings from Last 3 Encounters:  07/09/18 116/68  06/09/18 (!) 158/92  01/16/17 (!) 143/93        Assessment & Plan:  1. Class 2 obesity due to excess calories without serious comorbidity with body mass index (BMI) of 35.0 to 35.9 in adult - she is making important lifestyle changes and has seen some weight loss - encouraged continued good food choices and increased activity - discussed long term goals - follow up in 3 months  2. Prediabetes - will recheck at follow up  3. Essential hypertension - well controlled on HCTZ - follow up in 3 months  4. Chest pain, unspecified type - non exertional, usually related to stress, has  upcoming cardiology appointment due to obesity and family history   [name], FNP-BC  [facility], [facility]  07/09/2018 1:08 PM

## 2018-07-09 NOTE — Patient Instructions (Signed)
Good to see you today  Follow up in 3 months to check labs

## 2018-07-11 ENCOUNTER — Telehealth: Payer: Self-pay | Admitting: Family Medicine

## 2018-07-11 NOTE — Telephone Encounter (Signed)
Noted and sending message with other information to [name], NP.

## 2018-07-11 NOTE — Telephone Encounter (Signed)
Pt returned call to Anastasiya. She wants Debbie to know her insurance covers the cologuard. Pt also wanted to confirm with anastasiya that she did cancel the cardiology appointment because she feels she doesn't need to go.

## 2018-07-11 NOTE — Telephone Encounter (Signed)
Please place order for cologuard for colon cancer screening.

## 2018-07-14 NOTE — Telephone Encounter (Signed)
Paper work completed for [employer] and faxed as instructed.

## 2018-07-29 ENCOUNTER — Ambulatory Visit: Payer: 59 | Admitting: Interventional Cardiology

## 2018-07-29 DIAGNOSIS — Z1211 Encounter for screening for malignant neoplasm of colon: Secondary | ICD-10-CM | POA: Diagnosis not present

## 2018-07-29 DIAGNOSIS — Z1212 Encounter for screening for malignant neoplasm of rectum: Secondary | ICD-10-CM | POA: Diagnosis not present

## 2018-07-29 LAB — COLOGUARD: Cologuard: POSITIVE — AB

## 2018-08-05 ENCOUNTER — Telehealth: Payer: Self-pay

## 2018-08-05 NOTE — Telephone Encounter (Signed)
Kristy Cook with [employer] said abnormal cologuard result was faxed to [phone] on 08/04/18. Kristy Cook said if did not receive call [employer] back.

## 2018-08-05 NOTE — Telephone Encounter (Signed)
Found report and placed on your desk.

## 2018-08-06 ENCOUNTER — Other Ambulatory Visit: Payer: Self-pay | Admitting: Family Medicine

## 2018-08-06 ENCOUNTER — Encounter: Payer: Self-pay | Admitting: Family Medicine

## 2018-08-06 DIAGNOSIS — R195 Other fecal abnormalities: Secondary | ICD-10-CM

## 2018-08-06 NOTE — Progress Notes (Signed)
Positive Cologuard test. Mychart message sent to patient and referral placed for GI.

## 2018-08-06 NOTE — Telephone Encounter (Signed)
Mychart message sent to patient informing her of results. Referral to GI placed. Results to CMA for abstracting and scanning.

## 2018-08-14 ENCOUNTER — Encounter: Payer: Self-pay | Admitting: Gastroenterology

## 2018-08-15 ENCOUNTER — Encounter: Payer: Self-pay | Admitting: Gastroenterology

## 2018-08-15 ENCOUNTER — Ambulatory Visit: Payer: 59

## 2018-08-15 VITALS — Ht 60.0 in | Wt 182.0 lb

## 2018-08-15 DIAGNOSIS — R195 Other fecal abnormalities: Secondary | ICD-10-CM

## 2018-08-15 MED ORDER — NA SULFATE-K SULFATE-MG SULF 17.5-3.13-1.6 GM/177ML PO SOLN: 1.0000 | Freq: Once | ORAL | 0 refills | Status: AC

## 2018-08-15 NOTE — Progress Notes (Signed)
No egg or soy allergy known to patient  No issues with past sedation with any surgeries  or procedures, no intubation problems  No diet pills per patient No home 02 use per patient  No blood thinners per patient  Pt denies issues with constipation  No A fib or A flutter  EMMI video sent to pt's e mail.pt acepted

## 2018-08-28 ENCOUNTER — Encounter: Payer: 59 | Admitting: Gastroenterology

## 2018-09-01 ENCOUNTER — Ambulatory Visit (AMBULATORY_SURGERY_CENTER): Payer: 59 | Admitting: Gastroenterology

## 2018-09-01 ENCOUNTER — Encounter: Payer: Self-pay | Admitting: Gastroenterology

## 2018-09-01 ENCOUNTER — Other Ambulatory Visit: Payer: Self-pay

## 2018-09-01 VITALS — BP 148/62 | HR 86 | Temp 99.1°F | Resp 17

## 2018-09-01 DIAGNOSIS — Z1211 Encounter for screening for malignant neoplasm of colon: Secondary | ICD-10-CM | POA: Diagnosis not present

## 2018-09-01 DIAGNOSIS — D128 Benign neoplasm of rectum: Secondary | ICD-10-CM

## 2018-09-01 DIAGNOSIS — K621 Rectal polyp: Secondary | ICD-10-CM

## 2018-09-01 DIAGNOSIS — R195 Other fecal abnormalities: Secondary | ICD-10-CM

## 2018-09-01 DIAGNOSIS — D129 Benign neoplasm of anus and anal canal: Secondary | ICD-10-CM

## 2018-09-01 MED ORDER — SODIUM CHLORIDE 0.9 % IV SOLN: 500.0000 mL | Freq: Once | INTRAVENOUS | Status: DC

## 2018-09-01 NOTE — Patient Instructions (Addendum)
Handouts Provided:  Polyps  YOU HAD AN ENDOSCOPIC PROCEDURE TODAY AT THE [facility]:   Refer to the procedure report that was given to you for any specific questions about what was found during the examination.  If the procedure report does not answer your questions, please call your gastroenterologist to clarify.  If you requested that your care partner not be given the details of your procedure findings, then the procedure report has been included in a sealed envelope for you to review at your convenience later.  YOU SHOULD EXPECT: Some feelings of bloating in the abdomen. Passage of more gas than usual.  Walking can help get rid of the air that was put into your GI tract during the procedure and reduce the bloating. If you had a lower endoscopy (such as a colonoscopy or flexible sigmoidoscopy) you may notice spotting of blood in your stool or on the toilet paper. If you underwent a bowel prep for your procedure, you may not have a normal bowel movement for a few days.  Please Note:  You might notice some irritation and congestion in your nose or some drainage.  This is from the oxygen used during your procedure.  There is no need for concern and it should clear up in a day or so.  SYMPTOMS TO REPORT IMMEDIATELY:   Following lower endoscopy (colonoscopy or flexible sigmoidoscopy):  Excessive amounts of blood in the stool  Significant tenderness or worsening of abdominal pains  Swelling of the abdomen that is new, acute  Fever of 100F or higher  For urgent or emergent issues, a gastroenterologist can be reached at any hour by calling [phone].   DIET:  We do recommend a small meal at first, but then you may proceed to your regular diet.  Drink plenty of fluids but you should avoid alcoholic beverages for 24 hours.  ACTIVITY:  You should plan to take it easy for the rest of today and you should NOT DRIVE or use heavy machinery until tomorrow (because of the sedation  medicines used during the test).    FOLLOW UP: Our staff will call the number listed on your records the next business day following your procedure to check on you and address any questions or concerns that you may have regarding the information given to you following your procedure. If we do not reach you, we will leave a message.  However, if you are feeling well and you are not experiencing any problems, there is no need to return our call.  We will assume that you have returned to your regular daily activities without incident.  If any biopsies were taken you will be contacted by phone or by letter within the next 1-3 weeks.  Please call us at [phone] if you have not heard about the biopsies in 3 weeks.    SIGNATURES/CONFIDENTIALITY: You and/or your care partner have signed paperwork which will be entered into your electronic medical record.  These signatures attest to the fact that that the information above on your After Visit Summary has been reviewed and is understood.  Full responsibility of the confidentiality of this discharge information lies with you and/or your care-partner.  

## 2018-09-01 NOTE — Op Note (Signed)
[facility] Patient Name: Kristy Cook Procedure Date: 09/01/2018 3:59 PM MRN: [id number] Endoscopist: Remo Lipps P. Havery Moros , MD Age: 53 Referring MD:  Date of Birth: 09/27/1965 Gender: Female Account #: [id number] Procedure:                Colonoscopy Indications:              This is the patient's first colonoscopy, Positive                            fecal immunochemical test Medicines:                Monitored Anesthesia Care Procedure:                Pre-Anesthesia Assessment:                           - Prior to the procedure, a History and Physical                            was performed, and patient medications and                            allergies were reviewed. The patient's tolerance of                            previous anesthesia was also reviewed. The risks                            and benefits of the procedure and the sedation                            options and risks were discussed with the patient.                            All questions were answered, and informed consent                            was obtained. Prior Anticoagulants: The patient has                            taken no previous anticoagulant or antiplatelet                            agents. ASA Grade Assessment: II - A patient with                            mild systemic disease. After reviewing the risks                            and benefits, the patient was deemed in                            satisfactory condition to undergo the procedure.  After obtaining informed consent, the colonoscope                            was passed under direct vision. Throughout the                            procedure, the patient's blood pressure, pulse, and                            oxygen saturations were monitored continuously. The                            Colonoscope was introduced through the anus and                            advanced to the the cecum,  identified by                            appendiceal orifice and ileocecal valve. The                            colonoscopy was performed without difficulty. The                            patient tolerated the procedure well. The quality                            of the bowel preparation was inadequate. The                            ileocecal valve, appendiceal orifice, and rectum                            were photographed. Scope In: 4:09:38 PM Scope Out: 4:24:28 PM Scope Withdrawal Time: 0 hours 8 minutes 49 seconds  Total Procedure Duration: 0 hours 14 minutes 50 seconds  Findings:                 The perianal and digital rectal examinations were                            normal.                           A 5 mm polyp was found in the rectum. The polyp was                            sessile. The polyp was removed with a cold snare.                            Resection and retrieval were complete.                           A large amount of semi-liquid stool was found in  the entire colon, worst in the right colon, making                            visualization difficult. Lavage of the area was                            performed using copious amounts of sterile water,                            resulting in incomplete clearance with fair                            visualization. Cecal cap could not be cleared.                           Overall, no obvious mass lesions noted however the                            prep was inadequate for screening purposes despite                            several minutes spent lavaging the colon. The exam                            was otherwise without abnormality of what could be                            visualized. Complications:            No immediate complications. Estimated blood loss:                            Minimal. Estimated Blood Loss:     Estimated blood loss was minimal. Impression:               -  Preparation of the colon was inadequate                            unfortunately.                           - One 5 mm polyp in the rectum, removed with a cold                            snare. Resected and retrieved.                           - No obvious mass lesions but prep inadequate for                            screening purposes. Recommendation:           - Patient has a contact number available for                            emergencies. The  signs and symptoms of potential                            delayed complications were discussed with the                            patient. Return to normal activities tomorrow.                            Written discharge instructions were provided to the                            patient.                           - Resume previous diet.                           - Continue present medications.                           - Await pathology results.                           - Repeat colonoscopy because the bowel preparation                            was suboptimal at the patient's earliest                            convenience. Recommend 2 day prep Remo Lipps P. Armbruster, MD 09/01/2018 4:33:09 PM This report has been signed electronically.

## 2018-09-01 NOTE — Progress Notes (Signed)
Pt's states no medical or surgical changes since previsit or office visit. 

## 2018-09-01 NOTE — Progress Notes (Signed)
Called to room to assist during endoscopic procedure.  Patient ID and intended procedure confirmed with present staff. Received instructions for my participation in the procedure from the performing physician.  

## 2018-09-01 NOTE — Progress Notes (Signed)
Patient requested to wait until pathology report comes back to reschedule her repeat colonoscopy.

## 2018-09-01 NOTE — Progress Notes (Signed)
A and O x3. Report to RN. Tolerated MAC anesthesia well.

## 2018-09-02 ENCOUNTER — Telehealth: Payer: Self-pay | Admitting: *Deleted

## 2018-09-02 NOTE — Telephone Encounter (Signed)
  Follow up Call-  Call back number 09/01/2018  Post procedure Call Back phone  # [phone]  Permission to leave phone message Yes  Some recent data might be hidden     Patient questions:  Do you have a fever, pain , or abdominal swelling? No. Pain Score  0 *  Have you tolerated food without any problems? Yes.    Have you been able to return to your normal activities? Yes.    Do you have any questions about your discharge instructions: Diet   No. Medications  No. Follow up visit  No.  Do you have questions or concerns about your Care? No.  Actions: * If pain score is 4 or above: No action needed, pain <4.

## 2018-09-21 ENCOUNTER — Encounter: Payer: Self-pay | Admitting: Family Medicine

## 2018-09-22 ENCOUNTER — Encounter: Payer: Self-pay | Admitting: Family Medicine

## 2018-09-22 ENCOUNTER — Other Ambulatory Visit: Payer: Self-pay | Admitting: Family Medicine

## 2018-09-22 DIAGNOSIS — Z9189 Other specified personal risk factors, not elsewhere classified: Secondary | ICD-10-CM

## 2018-09-22 DIAGNOSIS — R079 Chest pain, unspecified: Secondary | ICD-10-CM

## 2018-09-22 DIAGNOSIS — E7849 Other hyperlipidemia: Secondary | ICD-10-CM

## 2018-09-22 DIAGNOSIS — I10 Essential (primary) hypertension: Secondary | ICD-10-CM

## 2018-09-22 DIAGNOSIS — I200 Unstable angina: Secondary | ICD-10-CM

## 2018-09-30 ENCOUNTER — Telehealth: Payer: Self-pay

## 2018-09-30 DIAGNOSIS — I10 Essential (primary) hypertension: Secondary | ICD-10-CM | POA: Insufficient documentation

## 2018-09-30 DIAGNOSIS — R7303 Prediabetes: Secondary | ICD-10-CM | POA: Insufficient documentation

## 2018-09-30 DIAGNOSIS — E785 Hyperlipidemia, unspecified: Secondary | ICD-10-CM | POA: Insufficient documentation

## 2018-09-30 NOTE — Progress Notes (Signed)
Virtual Visit via Video Note   This visit type was conducted due to national recommendations for restrictions regarding the COVID-19 Pandemic (e.g. social distancing) in an effort to limit this patient's exposure and mitigate transmission in our community.  Due to her co-morbid illnesses, this patient is at least at moderate risk for complications without adequate follow up.  This format is felt to be most appropriate for this patient at this time.  All issues noted in this document were discussed and addressed.  A limited physical exam was performed with this format.  Please refer to the patient's chart for her consent to telehealth for [facility].  Evaluation Performed:  Follow-up visit  This visit type was conducted due to national recommendations for restrictions regarding the COVID-19 Pandemic (e.g. social distancing).  This format is felt to be most appropriate for this patient at this time.  All issues noted in this document were discussed and addressed.  No physical exam was performed (except for noted visual exam findings with Video Visits).  Please refer to the patient's chart (MyChart message for video visits and phone note for telephone visits) for the patient's consent to telehealth for [facility].  Date:  10/01/2018   ID:  Kristy Cook, DOB 15-Aug-1965, MRN [id number]  Patient Location:  Home  Provider location:   Uncertain  PCP:  Elby Beck, FNP  Cardiologist:  NEW Electrophysiologist:  None   Chief Complaint:  HTN  History of Present Illness:    Kristy Cook is a 53 y.o. female who presents via audio/video conferencing for a telehealth visit today in referral by her PCP [name], FNP for evaluation of chest pain.   This is a 53yo female with a history of obesity, HTN, prediabetes and chest pain.  She was seen by her PCP December 2019 and mentioned that she was having episodes of chest pain that occur a few times monthly and only last a minute at  a time.   Her chest pain is sharp and midsternal with no radiation and there are no associated symptoms of diaphoresis, nausea.  The pain seems to be worse at night and less during the day.  She does not think that it is exertional related.  She has the pain is very sharp and worse if she takes a deep breath in.  She says she has had a history of gallstones in the past and really is unsure what this pain is coming from.  She says when she gets the pain she gets scared her chest gets more tight and then she gets short of breath.  She is really unclear as to whether food makes her pain worse although bacon has in the past but she is trying to stop eating this.   She has a family hx of CAD with her grandmother dying of an MI at 54yo.  She has also had some problems with HTN and was started on HCTZ 12.5mg  daily.  Her lipids were recently checked in Dec 2019 showing LDL of 123 and HDL 43.  HbA1C was 6.    The patient does not have symptoms concerning for COVID-19 infection (fever, chills, cough, or new shortness of breath).    Prior CV studies:   The following studies were reviewed today:  none  Past Medical History:  Diagnosis Date  . Abnormal liver enzymes 06/09/2018  . Allergy   . Benign essential HTN   . Chest pain   . Class 1 obesity without serious comorbidity  with body mass index (BMI) of 34.0 to 34.9 in adult 09/25/2016  . Constipation   . Gallbladder problem   . GERD (gastroesophageal reflux disease)   . Hyperlipidemia   . Kidney problem   . Pre-diabetes   . Vulvitis 06/09/2018   Past Surgical History:  Procedure Laterality Date  . CESAREAN SECTION       Current Meds  Medication Sig  . Cholecalciferol (VITAMIN D3) 1.25 MG (50000 UT) TABS Take 1 tablet by mouth every 7 (seven) days.  . Flurandrenolide (CORDRAN) 4 MCG/SQCM TAPE Cordran Tape Large Roll 4 mcg/cm2  . hydrochlorothiazide (MICROZIDE) 12.5 MG capsule Take 1 capsule (12.5 mg total) by mouth daily.  . valACYclovir  (VALTREX) 500 MG tablet valacyclovir 500 mg tablet  TK 1 T PO BID PRN     Allergies:   Latex and Peanut-containing drug products   Social History   Tobacco Use  . Smoking status: Never Smoker  . Smokeless tobacco: Never Used  Substance Use Topics  . Alcohol use: No  . Drug use: No     Family Hx: The patient's family history includes CAD in her maternal grandmother; Hyperlipidemia in her mother; Hypertension in her mother; Kidney disease in her mother; Obesity in her mother; Sudden death in her mother. There is no history of Colon cancer, Colon polyps, Esophageal cancer, Rectal cancer, or Stomach cancer.  ROS:   Please see the history of present illness.     All other systems reviewed and are negative.   Labs/Other Tests and Data Reviewed:    Recent Labs: 06/09/2018: ALT 9; BUN 12; Creatinine, Ser 0.70; Hemoglobin 13.2; Platelets 317.0; Potassium 3.8; Sodium 140; TSH 1.83   Recent Lipid Panel Lab Results  Component Value Date/Time   CHOL 181 06/09/2018 09:34 AM   CHOL 197 01/16/2017 03:05 PM   TRIG 75.0 06/09/2018 09:34 AM   HDL 43.00 06/09/2018 09:34 AM   HDL 49 01/16/2017 03:05 PM   CHOLHDL 4 06/09/2018 09:34 AM   LDLCALC 123 (H) 06/09/2018 09:34 AM   LDLCALC 135 (H) 01/16/2017 03:05 PM    Wt Readings from Last 3 Encounters:  10/01/18 183 lb (83 kg)  08/15/18 182 lb (82.6 kg)  07/09/18 181 lb (82.1 kg)     Objective:    Vital Signs:  Pulse 78   Temp 98.8 F (37.1 C)   Ht 5' (1.524 m)   Wt 183 lb (83 kg)   BMI 35.74 kg/m    Well nourished, well developed female in no acute distress. Well appearing, alert and conversant, regular work of breathing,  good skin color  Eyes- anicteric mouth- oral mucosa is pink  neuro- grossly intact skin- no apparent rash or lesions or cyanosis   ASSESSMENT & PLAN:    1.  Chest pain - her symptoms are somewhat atypical.  Her chest pain is sharp and midsternal with no radiation and there are no associated symptoms of  diaphoresis, nausea.  The pain seems to be worse at night and less during the day.  She does not think that it is exertional related.  She has the pain is very sharp and worse if she takes a deep breath in.  She says she has had a history of gallstones in the past and really is unsure what this pain is coming from.  She says when she gets the pain she gets scared her chest gets more tight and then she gets short of breath.  She is really unclear as to  whether food makes her pain worse although bacon has in the past but she is trying to stop eating this.  She does have CRFs including preDM, hyperlipidemia and HTN.  She does not smoke but her mom died of SCD although she is really not sure what led to the sudden cardiac death.  Her mother had end-stage renal disease and been trying peritoneal dialysis at home which did not work and her mother had been placed in the hospital and then died..  EKG is nonischemic.  I will get a stress echo in June once the COVID 19 crisis has resolved as well as a coronary Ca score to assess future risk.   Her symptoms actually sound more like this could be esophageal spasm with possible GERD.  I recommended a trial of Protonix 40 mg daily.  She will let me know if her symptoms worsen in the meantime.  2.  HTN  - her BP recently has been elevated and she was started on HCTZ 12.5mg  daily.    3.  Hyperlipidemia - she is currently not on a statin.  Her LDL was elevated at 123.  Her LDL goal is < 70 in setting of pre DM if she has an elevated calcium score on CT  COVID-19 Education: The signs and symptoms of COVID-19 were discussed with the patient and how to seek care for testing (follow up with PCP or arrange E-visit).  The importance of social distancing was discussed today.  Patient Risk:   After full review of this patient's clinical status, I feel that they are at least moderate risk at this time.  Time:   Today, I have spent 45 minutes with the patient reviewing chart and  discussing medical problems including Chest pain, SOB, HTN and reviewing symptoms of COVID 19 and the ways to protect against contracting the virus with telehealth technology.      Medication Adjustments/Labs and Tests Ordered: Current medicines are reviewed at length with the patient today.  Concerns regarding medicines are outlined above.  Tests Ordered: No orders of the defined types were placed in this encounter.  Medication Changes: No orders of the defined types were placed in this encounter.   Disposition:  Follow up prn  Signed, Fransico Him, MD  10/01/2018 2:12 PM    [facility]

## 2018-09-30 NOTE — Telephone Encounter (Signed)
TELEPHONE CALL NOTE  Kristy Cook has been deemed a candidate for a follow-up tele-health visit to limit community exposure during the Covid-19 pandemic. I spoke with the patient via phone to ensure availability of phone/video source, confirm preferred email & phone number, and discuss instructions and expectations.  I reminded Kristy Cook to be prepared with any vital sign and/or heart rhythm information that could potentially be obtained via home monitoring, at the time of her visit. I reminded Kristy Cook to expect a phone call at the time of her visit if her visit.  Did the patient verbally acknowledge consent to treatment? [place], RN 09/30/2018 2:52 PM   DOWNLOADING THE [place], go to [employer] and type in WebEx in the search bar. [place] [employer], the blue/green circle. The app is free but as with any other app downloads, their phone may require them to verify saved payment information or Apple password. The patient does NOT have to create an account.  - If Android, ask patient to go to [employer] and type in WebEx in the search bar. [place] [employer], the blue/green circle. The app is free but as with any other app downloads, their phone may require them to verify saved payment information or Android password. The patient does NOT have to create an account.   CONSENT FOR TELE-HEALTH VISIT - PLEASE REVIEW  I hereby voluntarily request, consent and authorize [facility] and its employed or contracted physicians, physician assistants, nurse practitioners or other licensed health care professionals (the Practitioner), to provide me with telemedicine health care services (the "Services") as deemed necessary by the treating Practitioner. I acknowledge and consent to receive the Services by the Practitioner via telemedicine. I understand that the telemedicine visit will involve communicating with the  Practitioner through live audiovisual communication technology and the disclosure of certain medical information by electronic transmission. I acknowledge that I have been given the opportunity to request an in-person assessment or other available alternative prior to the telemedicine visit and am voluntarily participating in the telemedicine visit.  I understand that I have the right to withhold or withdraw my consent to the use of telemedicine in the course of my care at any time, without affecting my right to future care or treatment, and that the Practitioner or I may terminate the telemedicine visit at any time. I understand that I have the right to inspect all information obtained and/or recorded in the course of the telemedicine visit and may receive copies of available information for a reasonable fee.  I understand that some of the potential risks of receiving the Services via telemedicine include:  Marland Kitchen Delay or interruption in medical evaluation due to technological equipment failure or disruption; . Information transmitted may not be sufficient (e.g. poor resolution of images) to allow for appropriate medical decision making by the Practitioner; and/or  . In rare instances, security protocols could fail, causing a breach of personal health information.  Furthermore, I acknowledge that it is my responsibility to provide information about my medical history, conditions and care that is complete and accurate to the best of my ability. I acknowledge that Practitioner's advice, recommendations, and/or decision may be based on factors not within their control, such as incomplete or inaccurate data provided by me or distortions of diagnostic images or specimens that may result from electronic transmissions. I understand that the practice of medicine is not an exact science and  that Practitioner makes no warranties or guarantees regarding treatment outcomes. I acknowledge that I will receive a copy of this  consent concurrently upon execution via email to the email address I last provided but may also request a printed copy by calling the office of [place].    I understand that my insurance will be billed for this visit.   I have read or had this consent read to me. . I understand the contents of this consent, which adequately explains the benefits and risks of the Services being provided via telemedicine.  . I have been provided ample opportunity to ask questions regarding this consent and the Services and have had my questions answered to my satisfaction. . I give my informed consent for the services to be provided through the use of telemedicine in my medical care  By participating in this telemedicine visit I agree to the above.

## 2018-10-01 ENCOUNTER — Telehealth (INDEPENDENT_AMBULATORY_CARE_PROVIDER_SITE_OTHER): Payer: 59 | Admitting: Cardiology

## 2018-10-01 ENCOUNTER — Other Ambulatory Visit: Payer: Self-pay

## 2018-10-01 ENCOUNTER — Encounter: Payer: Self-pay | Admitting: Cardiology

## 2018-10-01 ENCOUNTER — Other Ambulatory Visit: Payer: Self-pay | Admitting: Family Medicine

## 2018-10-01 DIAGNOSIS — I10 Essential (primary) hypertension: Secondary | ICD-10-CM

## 2018-10-01 DIAGNOSIS — E7800 Pure hypercholesterolemia, unspecified: Secondary | ICD-10-CM | POA: Diagnosis not present

## 2018-10-01 DIAGNOSIS — R079 Chest pain, unspecified: Secondary | ICD-10-CM

## 2018-10-01 DIAGNOSIS — R7303 Prediabetes: Secondary | ICD-10-CM

## 2018-10-01 MED ORDER — PANTOPRAZOLE SODIUM 40 MG PO TBEC: 40.0000 mg | DELAYED_RELEASE_TABLET | Freq: Every day | ORAL | 3 refills | Status: AC

## 2018-10-01 NOTE — Patient Instructions (Signed)
Medication Instructions:  Start: Protonix 40 mg, daily, by mouth  If you need a refill on your cardiac medications before your next appointment, please call your pharmacy.   Lab work: None If you have labs (blood work) drawn today and your tests are completely normal, you will receive your results only by: Marland Kitchen MyChart Message (if you have MyChart) OR . A paper copy in the mail If you have any lab test that is abnormal or we need to change your treatment, we will call you to review the results.  Testing/Procedures: Your physician has requested that you have a stress echocardiogram 12/13/18. For further information please visit [email or web address]. Please follow instruction sheet as given.  Schedule a Cardiac Scoring around 12/13/18  Follow-Up: As needed, pending results.  Exercise Stress Test An exercise stress test is a test to check how your heart works during exercise. You will need to walk on a treadmill or ride an exercise bike for this test. An electrocardiogram (ECG) will record your heartbeat when you are at rest and when you are exercising. You may have an ultrasound or nuclear test after the exercise test. The test is done to check for coronary artery disease (CAD). It is also done to:  See how well you can exercise.  Watch for high blood pressure during exercise.  Test how well you can exercise after treatment.  Check the blood flow to your arms and legs. If your test result is not normal, more testing may be needed. What happens before the procedure?  Follow instructions from your doctor about what you cannot eat or drink. ? Do not have any drinks or foods that have caffeine in them for 24 hours before the test, or as told by your doctor. This includes coffee, tea (even decaf tea), sodas, chocolate, and cocoa.  If you use an inhaler, bring it with you to the test.  Do not put lotions, powders, creams, or oils on your chest before the test.  Wear comfortable shoes and  clothing.  Do not use any products that have nicotine or tobacco in them, such as cigarettes and e-cigarettes. Stop using them at least 4 hours before the test. If you need help quitting, ask your doctor. What happens during the procedure?  Patches (electrodes) will be put on your chest.  Wires will be connected to the patches. The wires will send signals to a machine to record your heartbeat.  Your heart rate will be watched while you are resting and while you are exercising. Your blood pressure will also be watched during the test.  You will walk on a treadmill or use a stationary bike. If you cannot use these, you may be asked to turn a crank with your hands.  The activity will get harder and will raise your heart rate.  You may be asked to breathe into a tube a few times during the test. This measures the gases that you breathe out.  You will be asked how you are feeling throughout the test.  You will exercise until your heart reaches a target heart rate. You will stop early if: ? You feel dizzy. ? You have chest pain. ? You are out of breath. ? Your blood pressure is too high or too low. ? You have an irregular heartbeat. ? You have pain or aching in your arms or legs. The procedure may vary among doctors and hospitals. What happens after the procedure?  Your blood pressure, heart rate, breathing rate, and blood  oxygen level will be watched after the test.  You may return to your normal diet and activities as told by your doctor.  It is up to you to get the results of your test. Ask your doctor, or the department that is doing the test, when your results will be ready. Summary  An exercise stress test is a test to check how your heart works during exercise.  This test is done to check for coronary artery disease.  Your heart rate will be watched while you are resting and while you are exercising.  Follow instructions from your doctor about what you cannot eat or drink  before the test. This information is not intended to replace advice given to you by your health care provider. Make sure you discuss any questions you have with your health care provider. Document Released: 11/28/2007 Document Revised: 09/11/2016 Document Reviewed: 09/11/2016 Elsevier Interactive Patient Education  2019 [employer].

## 2018-10-08 ENCOUNTER — Other Ambulatory Visit: Payer: Self-pay

## 2018-10-08 ENCOUNTER — Other Ambulatory Visit: Payer: 59

## 2018-10-08 ENCOUNTER — Other Ambulatory Visit (INDEPENDENT_AMBULATORY_CARE_PROVIDER_SITE_OTHER): Payer: 59

## 2018-10-08 DIAGNOSIS — I10 Essential (primary) hypertension: Secondary | ICD-10-CM

## 2018-10-08 LAB — BASIC METABOLIC PANEL
BUN: 13 mg/dL (ref 6–23)
CO2: 29 mEq/L (ref 19–32)
Calcium: 9.6 mg/dL (ref 8.4–10.5)
Chloride: 102 mEq/L (ref 96–112)
Creatinine, Ser: 0.7 mg/dL (ref 0.40–1.20)
GFR: 105.98 mL/min (ref >60.00)
Glucose, Bld: 101 mg/dL — ABNORMAL HIGH (ref 70–99)
Potassium: 4.1 mEq/L (ref 3.5–5.1)
Sodium: 139 mEq/L (ref 135–145)

## 2018-10-12 ENCOUNTER — Encounter: Payer: Self-pay | Admitting: Family Medicine

## 2018-11-10 DIAGNOSIS — Z1231 Encounter for screening mammogram for malignant neoplasm of breast: Secondary | ICD-10-CM | POA: Diagnosis not present

## 2018-11-18 ENCOUNTER — Encounter: Payer: Self-pay | Admitting: Family Medicine

## 2018-12-11 ENCOUNTER — Other Ambulatory Visit (HOSPITAL_COMMUNITY): Payer: 59

## 2018-12-12 ENCOUNTER — Other Ambulatory Visit (HOSPITAL_COMMUNITY): Payer: 59

## 2018-12-15 ENCOUNTER — Other Ambulatory Visit (HOSPITAL_COMMUNITY): Payer: 59

## 2018-12-15 DIAGNOSIS — R079 Chest pain, unspecified: Secondary | ICD-10-CM

## 2018-12-15 NOTE — Telephone Encounter (Signed)
Spoke with the patient, advised the patient that I requested other testing since the patient does not want to take off more work then she needs to. She declined COVID testing since it would take her out of work.

## 2018-12-16 ENCOUNTER — Other Ambulatory Visit (HOSPITAL_COMMUNITY): Payer: 59

## 2018-12-16 ENCOUNTER — Inpatient Hospital Stay: Admission: RE | Admit: 2018-12-16 | Payer: 59 | Source: Ambulatory Visit

## 2018-12-16 ENCOUNTER — Encounter

## 2018-12-17 NOTE — Telephone Encounter (Signed)
Spoke with the patient, she expressed understanding about switching to a Lexiscan stress test. Instructions discussed and sent via [place].

## 2018-12-22 ENCOUNTER — Encounter (HOSPITAL_COMMUNITY): Payer: Self-pay | Admitting: Cardiology

## 2018-12-31 ENCOUNTER — Other Ambulatory Visit: Payer: 59

## 2019-01-19 ENCOUNTER — Telehealth (HOSPITAL_COMMUNITY): Payer: Self-pay

## 2019-01-19 NOTE — Telephone Encounter (Signed)
Noted ./cy 

## 2019-01-19 NOTE — Telephone Encounter (Signed)
New message   Just an FYI.   We will be removing the patient from the [facility].  7.27.20 @ 10:24am spoke with patient do not want to be in the office  3-4 hours decided to wait - Kissa Campoy   7.16.20 @ 11;19am both # are the same -unable to leave a vm Mayana Irigoyen  6.29.20 mail reminder letter Malijah Lietz  6.25.20 @ 9:38am lm on home vm - Joua Bake

## 2019-03-15 ENCOUNTER — Encounter: Payer: Self-pay | Admitting: Family Medicine

## 2019-03-16 ENCOUNTER — Other Ambulatory Visit: Payer: Self-pay | Admitting: Family Medicine

## 2019-03-16 DIAGNOSIS — E559 Vitamin D deficiency, unspecified: Secondary | ICD-10-CM

## 2019-06-01 ENCOUNTER — Other Ambulatory Visit: Payer: Self-pay

## 2019-06-01 DIAGNOSIS — Z20822 Contact with and (suspected) exposure to covid-19: Secondary | ICD-10-CM

## 2019-06-03 ENCOUNTER — Ambulatory Visit: Payer: Self-pay | Admitting: *Deleted

## 2019-06-03 ENCOUNTER — Ambulatory Visit: Payer: 59 | Admitting: Internal Medicine

## 2019-06-03 LAB — NOVEL CORONAVIRUS, NAA: SARS-CoV-2, NAA: NOT DETECTED

## 2019-06-03 NOTE — Telephone Encounter (Signed)
Patient was transferred to our office because she was advised to go to the ER and she refused. Patient stated that she had chest pain last week and some SOB Monday. Patient stated that she was exposed to covid Thanksgiving and has been tested twice which both test came back negative on 05/26/19 and 06/01/19. Patient stated that she does not feel that she needs to go to the ER now because she is not having any symptoms now and just wants to schedule a virtual visit. Advised patient that her PCP is out of the office, so she agreed to a virtual visit with Kristy Silversmith NP today. . Patient stated that if she developes any severe symptoms she would go to an Urgent Care or an ER. ER precautions were given to patient and she verbalized understanding.

## 2019-06-03 NOTE — Telephone Encounter (Signed)
Contacted by Shirlean Mylar at [facility] for triage; the pt called with complaints of chest pain and SOB which started 05/22/2019; the pt was exposed to [place] on 05/21/2019 she has tested negative COVID on 06/01/2019, and a prior test at [facility] on 05/26/2019; her SOB started first; the pt says that she had these episodes before, but they are becoming more frequently; she is also having a runny nose; recommendations made per nurse triage protocol; she verbalized understanding; however she would like to be seen; the pt sees [name]; pt transferred to [facility] for final disposition.     Reason for Disposition . [1] MODERATE difficulty breathing (e.g., speaks in phrases, SOB even at rest, pulse 100-120) AND [2] NEW-onset or WORSE than normal  Answer Assessment - Initial Assessment Questions 1. RESPIRATORY STATUS: "Describe your breathing?" (e.g., wheezing, shortness of breath, unable to speak, severe coughing)      shortness of breath; last episode 06/01/2019 2. ONSET: "When did this breathing problem begin?"   05/22/2019 3. PATTERN "Does the difficult breathing come and go, or has it been constant since it started?"     Intermittent; without exertion 4. SEVERITY: "How bad is your breathing?" (e.g., mild, moderate, severe)    - MILD: No SOB at rest, mild SOB with walking, speaks normally in sentences, can lay down, no retractions, pulse < 100.    - MODERATE: SOB at rest, SOB with minimal exertion and prefers to sit, cannot lie down flat, speaks in phrases, mild retractions, audible wheezing, pulse 100-120.    - SEVERE: Very SOB at rest, speaks in single words, struggling to breathe, sitting hunched forward, retractions, pulse > 120     Moderate 5. RECURRENT SYMPTOM: "Have you had difficulty breathing before?" If so, ask: "When was the last time?" and "What happened that time?"     Yes; has been months between episodes 6. CARDIAC HISTORY: "Do you have any history of heart disease?" (e.g., heart attack,  angina, bypass surgery, angioplasty)      no 7. LUNG HISTORY: "Do you have any history of lung disease?"  (e.g., pulmonary embolus, asthma, emphysema)     no 8. CAUSE: "What do you think is causing the breathing problem?"      Not sure 9. OTHER SYMPTOMS: "Do you have any other symptoms? (e.g., dizziness, runny nose, cough, chest pain, fever)     Chest pain in middle of chest rated 8 out of 10; last episode 06/01/2019 10. PREGNANCY: "Is there any chance you are pregnant?" "When was your last menstrual period?"       No; LMP 2019 11. TRAVEL: "Have you traveled out of the country in the last month?" (e.g., travel history, exposures)       no  Protocols used: BREATHING DIFFICULTY-A-AH

## 2019-06-03 NOTE — Telephone Encounter (Signed)
She needs to be seen in office. Should be a 30 minute appt

## 2019-06-04 ENCOUNTER — Encounter: Payer: Self-pay | Admitting: Internal Medicine

## 2019-06-04 ENCOUNTER — Ambulatory Visit: Payer: 59 | Admitting: Internal Medicine

## 2019-06-04 ENCOUNTER — Other Ambulatory Visit: Payer: Self-pay

## 2019-06-04 ENCOUNTER — Ambulatory Visit (INDEPENDENT_AMBULATORY_CARE_PROVIDER_SITE_OTHER)
Admission: RE | Admit: 2019-06-04 | Discharge: 2019-06-04 | Disposition: A | Discharge status: Home / Self Care | Payer: 59 | Source: Ambulatory Visit | Attending: Internal Medicine | Admitting: Internal Medicine

## 2019-06-04 VITALS — BP 132/84 | HR 80 | Temp 97.9°F | Wt 183.0 lb

## 2019-06-04 DIAGNOSIS — K219 Gastro-esophageal reflux disease without esophagitis: Secondary | ICD-10-CM

## 2019-06-04 DIAGNOSIS — F419 Anxiety disorder, unspecified: Secondary | ICD-10-CM

## 2019-06-04 DIAGNOSIS — R079 Chest pain, unspecified: Secondary | ICD-10-CM

## 2019-06-04 DIAGNOSIS — R0602 Shortness of breath: Secondary | ICD-10-CM

## 2019-06-04 LAB — COMPREHENSIVE METABOLIC PANEL
ALT: 9 U/L (ref 0–35)
AST: 10 U/L (ref 0–37)
Albumin: 4.2 g/dL (ref 3.5–5.2)
Alkaline Phosphatase: 75 U/L (ref 39–117)
BUN: 13 mg/dL (ref 6–23)
CO2: 28 mEq/L (ref 19–32)
Calcium: 9.3 mg/dL (ref 8.4–10.5)
Chloride: 103 mEq/L (ref 96–112)
Creatinine, Ser: 0.73 mg/dL (ref 0.40–1.20)
GFR: 100.72 mL/min (ref >60.00)
Glucose, Bld: 104 mg/dL — ABNORMAL HIGH (ref 70–99)
Potassium: 3.9 mEq/L (ref 3.5–5.1)
Sodium: 139 mEq/L (ref 135–145)
Total Bilirubin: 0.4 mg/dL (ref 0.2–1.2)
Total Protein: 8 g/dL (ref 6.0–8.3)

## 2019-06-04 LAB — LIPID PANEL
Cholesterol: 200 mg/dL (ref 0–200)
HDL: 52.3 mg/dL (ref >39.00)
LDL Cholesterol: 139 mg/dL — ABNORMAL HIGH (ref 0–99)
NonHDL: 147.33
Total CHOL/HDL Ratio: 4
Triglycerides: 41 mg/dL (ref 0.0–149.0)
VLDL: 8.2 mg/dL (ref 0.0–40.0)

## 2019-06-04 LAB — CBC
HCT: 39.6 % (ref 36.0–46.0)
Hemoglobin: 12.6 g/dL (ref 12.0–15.0)
MCHC: 31.8 g/dL (ref 30.0–36.0)
MCV: 81.4 fl (ref 78.0–100.0)
Platelets: 335 10*3/uL (ref 150.0–400.0)
RBC: 4.86 Mil/uL (ref 3.87–5.11)
RDW: 14.7 % (ref 11.5–15.5)
WBC: 8.1 10*3/uL (ref 4.0–10.5)

## 2019-06-04 LAB — TSH: TSH: 1.43 u[IU]/mL (ref 0.35–4.50)

## 2019-06-04 NOTE — Patient Instructions (Signed)
Atrial Flutter    Atrial flutter is a type of abnormal heart rhythm (arrhythmia). The heart has an electrical system that tells the heart how to beat. In atrial flutter, the signals move rapidly in the top chambers of the heart (the atria). This makes your heart beat very fast. Atrial flutter can come and go, or it can be permanent.  If this condition is not treated it can cause serious complications, such as stroke or weakened heart muscle (cardiomyopathy).  What are the causes?  This condition may be caused by:   A heart condition or problem, such as:  ? A heart attack.  ? Heart failure.  ? A heart valve problem.  ? Heart surgery.   A lung problem, such as:  ? A blood clot in the lungs (pulmonary embolism, or PE).  ? Chronic obstructive pulmonary disease.   Poorly controlled high blood pressure (hypertension).   Overactive thyroid (hyperthyroidism).   Caffeine.   Some decongestant cold medicines.   Low levels of minerals called electrolytes in the blood.   Cocaine.  What increases the risk?  You are more likely to develop this condition if:   You are an elderly adult.   You are a man.   You are obese.   You have obstructive sleep apnea.   You have a family history of atrial flutter.   You have diabetes.  What are the signs or symptoms?  Symptoms of this condition include:   A feeling that your heart is pounding or racing (palpitations).   Shortness of breath.   Chest pain.   Feeling light-headed.   Dizziness.   Fainting.   Low blood pressure (hypotension).   Fatigue.  Sometimes there are no symptoms associated with arrhythmia.  How is this diagnosed?  This condition may be diagnosed based on:   An electrocardiogram (ECG). This is a test that records the electrical signals in the heart.   Ambulatory cardiac monitoring. This is a small recording device that is connected by wires to flat, sticky disks (electrodes) that are attached to your chest.   An echocardiogram. This is a test that uses  sound waves to create pictures of your heart.   A transesophageal echocardiogram (TEE). In this test, a device is placed down your esophagus. This device then uses sounds waves to create even closer pictures of your heart.   Stress test. This test records your heartbeat while you exercise and checks to see if the heart muscle is receiving adequate blood supply.  How is this treated?  This condition may be treated with:   Medicines to:  ? Make your heart beat more slowly.  ? Keep your heart in normal rhythm.  ? Prevent a stroke.   Cardioversion. This uses medicines or an electrical shock to make the heart beat normally.   Ablation. This destroys the heart tissue that is causing the problem.  In some cases, your health care provider will treat other underlying conditions.  Follow these instructions at home:  Medicines   Take over-the-counter and prescription medicines only as told by your health care provider.  ? Make sure you take your medicines exactly as told by your health care provider.  ? Do not miss any doses.   Do not take any new medicines without talking to your health care provider.  Lifestyle   Eat heart-healthy foods. Talk with a dietitian to make an eating plan that is right for you.   Do not use any products that   stress. Stress can make your symptoms worse.  Get screened for sleep apnea. If you have the condition, work with your health care provider to find a treatment that works for you.  Do not use drugs.  Avoid excessive caffeine. General instructions  Lose weight if your health care provider tells you to do that.  Keep all follow-up visits as told by your health  care provider. This is important. Contact a health care provider if:  Your symptoms get worse.  You notice that your palpitations are increasing. Get help right away if:  You have any symptoms of a stroke. "BE FAST" is an easy way to remember the main warning signs of a stroke: ? B - Balance. Signs are dizziness, sudden trouble walking, or loss of balance. ? E - Eyes. Signs are trouble seeing or a sudden change in vision. ? F - Face. Signs are sudden weakness or numbness of the face, or the face or eyelid drooping on one side. ? A - Arms. Signs are weakness or numbness in an arm. This happens suddenly and usually on one side of the body. ? S - Speech. Signs are sudden trouble speaking, slurred speech, or trouble understanding what people say. ? T - Time. Time to call emergency services. Write down what time symptoms started.  You have other signs of a stroke, such as: ? A sudden, severe headache with no known cause. ? Nausea or vomiting. ? Seizure.  You have additional symptoms, such as: ? Fainting. ? Shortness of breath. ? Pain or pressure in your chest. ? Suddenly feeling nauseous or suddenly vomiting. ? Increased sweating with no known cause.  These symptoms may represent a serious problem that is an emergency. Do not wait to see if the symptoms will go away. Get medical help right away. Call your local emergency services (911 in the [place] Do not drive yourself to the hospital. Summary  Atrial flutter is an abnormal heart rhythm that can give you symptoms of palpitations, shortness of breath, or fatigue.  Atrial flutter is often treated with medicines to keep your heart in a normal rhythm and to prevent a stroke.  You should seek immediate help if you cannot catch your breath, have chest pain or pressure, or have weakness, especially on one side of your body. This information is not intended to replace advice given to you by your health care provider. Make sure you discuss any  questions you have with your health care provider. Document Released: 10/28/2008 Document Revised: 02/28/2018 Document Reviewed: 03/14/2017 Elsevier Patient Education  2020 [employer].

## 2019-06-04 NOTE — Progress Notes (Signed)
Subjective:    Patient ID: Kristy Cook, female    DOB: [birth year], 53 y.o.   MRN: [id number]  HPI  Pt presents to the clinic today with c/o shortness of breath and chest pain. She reports the most recent episode (has had similar episodes in the past), started 05/22/19. She describes the chest pain as sharp. It does not radiate. She denies associated dizziness, lightheadedness, nausea, vomiting, abdominal pain or near syncope.  She feels like the shortness of breath may be anxiety related.  She has had intermittent episodes of this in the past.  She has seen cardiology for the same.  They have ordered a stress test, myocardial perfusion imaging and CT calcium score which she has not scheduled.  She has borderline hypertension currently managed on HCTZ.  She is not on cholesterol-lowering medication at this time.  She is not taking any aspirin OTC.  Review of Systems  Past Medical History:  Diagnosis Date  . Abnormal liver enzymes 06/09/2018  . Allergy   . Benign essential HTN   . Chest pain   . Class 1 obesity without serious comorbidity with body mass index (BMI) of 34.0 to 34.9 in adult 09/25/2016  . Constipation   . Gallbladder problem   . GERD (gastroesophageal reflux disease)   . Hyperlipidemia   . Kidney problem   . Pre-diabetes   . Vulvitis 06/09/2018    Current Outpatient Medications  Medication Sig Dispense Refill  . Cholecalciferol (VITAMIN D3) 1.25 MG (50000 UT) CAPS Tale 1 capsule once a week. Need appointment for further refills 12 capsule 0  . Flurandrenolide (CORDRAN) 4 MCG/SQCM TAPE Cordran Tape Large Roll 4 mcg/cm2    . hydrochlorothiazide (MICROZIDE) 12.5 MG capsule Take 1 capsule (12.5 mg total) by mouth daily. 90 capsule 3  . pantoprazole (PROTONIX) 40 MG tablet Take 1 tablet (40 mg total) by mouth daily. 90 tablet 3  . valACYclovir (VALTREX) 500 MG tablet valacyclovir 500 mg tablet  TK 1 T PO BID PRN     No current facility-administered medications for this  visit.    Allergies  Allergen Reactions  . Latex   . Peanut-Containing Drug Products Swelling and Rash    Family History  Problem Relation Age of Onset  . Hypertension Mother   . Hyperlipidemia Mother   . Sudden death Mother        died in the hospital - daughter unsure what happened - ESRD  . Kidney disease Mother        stage 4  . Obesity Mother   . CAD Maternal Grandmother        died from MI  . Colon cancer Neg Hx   . Colon polyps Neg Hx   . Esophageal cancer Neg Hx   . Rectal cancer Neg Hx   . Stomach cancer Neg Hx     Social History   Socioeconomic History  . Marital status: Single    Spouse name: Not on file  . Number of children: Not on file  . Years of education: Not on file  . Highest education level: Not on file  Occupational History  . Occupation: Air traffic controller: [employer]  Tobacco Use  . Smoking status: Never Smoker  . Smokeless tobacco: Never Used  Substance and Sexual Activity  . Alcohol use: No  . Drug use: No  . Sexual activity: Not on file  Other Topics Concern  . Not on file  Social History  Narrative  . Not on file   Social Determinants of Health   Financial Resource Strain:   . Difficulty of Paying Living Expenses: Not on file  Food Insecurity:   . Worried About Charity fundraiser in the Last Year: Not on file  . Ran Out of Food in the Last Year: Not on file  Transportation Needs:   . Lack of Transportation (Medical): Not on file  . Lack of Transportation (Non-Medical): Not on file  Physical Activity:   . Days of Exercise per Week: Not on file  . Minutes of Exercise per Session: Not on file  Stress:   . Feeling of Stress : Not on file  Social Connections:   . Frequency of Communication with Friends and Family: Not on file  . Frequency of Social Gatherings with Friends and Family: Not on file  . Attends Religious Services: Not on file  . Active Member of Clubs or Organizations: Not on file   . Attends Archivist Meetings: Not on file  . Marital Status: Not on file  Intimate Partner Violence:   . Fear of Current or Ex-Partner: Not on file  . Emotionally Abused: Not on file  . Physically Abused: Not on file  . Sexually Abused: Not on file     Constitutional: Denies fever, malaise, fatigue, headache or abrupt weight changes.  Respiratory: Patient remittent shortness of breath.  Denies difficulty breathing,  cough or sputum production.   Cardiovascular: Patient reports intermittent chest pain.  Denies chest pain, chest tightness, palpitations or swelling in the hands or feet.  Gastrointestinal: Denies abdominal pain, bloating, constipation, diarrhea or blood in the stool.  Musculoskeletal: Denies decrease in range of motion, difficulty with gait, muscle pain or joint pain and swelling.  Neurological: Denies dizziness, difficulty with memory, difficulty with speech or problems with balance and coordination.  Psych: Patient reports intermittent anxiety.  Denies depression, SI/HI.  No other specific complaints in a complete review of systems (except as listed in HPI above).     Objective:   Physical Exam   BP 132/84   Pulse 80   Temp 97.9 F (36.6 C) (Temporal)   Wt 183 lb (83 kg)   SpO2 98%   BMI 35.74 kg/m  Wt Readings from Last 3 Encounters:  06/04/19 183 lb (83 kg)  10/01/18 183 lb (83 kg)  08/15/18 182 lb (82.6 kg)    General: Appears her stated age, obese, in NAD. Skin: Warm, dry and intact. No rashes noted. Neck:  Neck supple, trachea midline. No masses, lumps or thyromegaly present.  Cardiovascular: Normal rate and rhythm. S1,S2 noted.  No murmur, rubs or gallops noted. No JVD or BLE edema. Pulmonary/Chest: Normal effort and positive vesicular breath sounds. No respiratory distress. No wheezes, rales or ronchi noted.  Abdomen: Soft and nontender. Normal bowel sounds. No distention or masses noted.  Musculoskeletal: Chest wall nontender to  palpation. Neurological: Alert and oriented.\ Psychiatric: Mildly anxious appearing.  BMET    Component Value Date/Time   NA 139 10/08/2018 1010   NA 141 01/16/2017 1505   K 4.1 10/08/2018 1010   CL 102 10/08/2018 1010   CO2 29 10/08/2018 1010   GLUCOSE 101 (H) 10/08/2018 1010   BUN 13 10/08/2018 1010   BUN 10 01/16/2017 1505   CREATININE 0.70 10/08/2018 1010   CALCIUM 9.6 10/08/2018 1010   GFRNONAA 104 01/16/2017 1505   GFRAA 120 01/16/2017 1505    Lipid Panel     Component  Value Date/Time   CHOL 181 06/09/2018 0934   CHOL 197 01/16/2017 1505   TRIG 75.0 06/09/2018 0934   HDL 43.00 06/09/2018 0934   HDL 49 01/16/2017 1505   CHOLHDL 4 06/09/2018 0934   VLDL 15.0 06/09/2018 0934   LDLCALC 123 (H) 06/09/2018 0934   LDLCALC 135 (H) 01/16/2017 1505    CBC    Component Value Date/Time   WBC 9.2 06/09/2018 0934   RBC 4.99 06/09/2018 0934   HGB 13.2 06/09/2018 0934   HGB 13.3 09/25/2016 0940   HCT 40.7 06/09/2018 0934   HCT 41.4 09/25/2016 0940   PLT 317.0 06/09/2018 0934   PLT 324 09/25/2016 0940   MCV 81.6 06/09/2018 0934   MCV 83 09/25/2016 0940   MCH 26.5 (L) 09/25/2016 0940   MCH 27.4 09/18/2014 1808   MCHC 32.5 06/09/2018 0934   RDW 14.4 06/09/2018 0934   RDW 14.5 09/25/2016 0940   LYMPHSABS 2.4 06/09/2018 0934   LYMPHSABS 2.8 09/25/2016 0940   MONOABS 0.9 06/09/2018 0934   EOSABS 0.1 06/09/2018 0934   EOSABS 0.1 09/25/2016 0940   BASOSABS 0.0 06/09/2018 0934   BASOSABS 0.0 09/25/2016 0940    Hgb A1C Lab Results  Component Value Date   HGBA1C 6.0 06/09/2018        Assessment & Plan:   Chest Pain, Shortness of Breath, Anxiety:  Indication for ECG: Chest pain and shortness of breath Interpretation of ECG: A flutter although I feel like this is artifact.  [facility]G [facility] to [facility] MD for further interpretation.  She reports it is NSR. Comparison of ECG: 05/2018 We will check CBC, C met, TSH and lipid profile today Chest x-ray today Encouraged her to  schedule her calcium CT score-number provided If work-up normal, likely anxiety Continue Pantoprazole for reflux  We will follow-up after labs and x-rays, return precautions discussed Webb Silversmith, NP This visit occurred during the SARS-CoV-2 public health emergency.  Safety protocols were in place, including screening questions prior to the visit, additional usage of staff PPE, and extensive cleaning of exam room while observing appropriate contact time as indicated for disinfecting solutions.

## 2019-06-05 ENCOUNTER — Encounter: Payer: Self-pay | Admitting: Internal Medicine

## 2019-09-23 ENCOUNTER — Ambulatory Visit: Payer: 59 | Admitting: Family Medicine

## 2019-09-23 ENCOUNTER — Encounter: Payer: Self-pay | Admitting: Family Medicine

## 2019-09-23 ENCOUNTER — Other Ambulatory Visit: Payer: Self-pay

## 2019-09-23 VITALS — BP 132/70 | HR 88 | Temp 98.5°F | Ht 60.0 in | Wt 182.0 lb

## 2019-09-23 DIAGNOSIS — R7303 Prediabetes: Secondary | ICD-10-CM

## 2019-09-23 DIAGNOSIS — S161XXA Strain of muscle, fascia and tendon at neck level, initial encounter: Secondary | ICD-10-CM | POA: Diagnosis not present

## 2019-09-23 DIAGNOSIS — E559 Vitamin D deficiency, unspecified: Secondary | ICD-10-CM | POA: Diagnosis not present

## 2019-09-23 DIAGNOSIS — I10 Essential (primary) hypertension: Secondary | ICD-10-CM | POA: Diagnosis not present

## 2019-09-23 LAB — BASIC METABOLIC PANEL
BUN: 14 mg/dL (ref 6–23)
CO2: 31 mEq/L (ref 19–32)
Calcium: 9.7 mg/dL (ref 8.4–10.5)
Chloride: 102 mEq/L (ref 96–112)
Creatinine, Ser: 0.74 mg/dL (ref 0.40–1.20)
GFR: 99.04 mL/min (ref >60.00)
Glucose, Bld: 124 mg/dL — ABNORMAL HIGH (ref 70–99)
Potassium: 3.7 mEq/L (ref 3.5–5.1)
Sodium: 140 mEq/L (ref 135–145)

## 2019-09-23 LAB — LIPID PANEL
Cholesterol: 191 mg/dL (ref 0–200)
HDL: 40.8 mg/dL (ref >39.00)
LDL Cholesterol: 129 mg/dL — ABNORMAL HIGH (ref 0–99)
NonHDL: 150.09
Total CHOL/HDL Ratio: 5
Triglycerides: 107 mg/dL (ref 0.0–149.0)
VLDL: 21.4 mg/dL (ref 0.0–40.0)

## 2019-09-23 LAB — VITAMIN D 25 HYDROXY (VIT D DEFICIENCY, FRACTURES): VITD: 54.76 ng/mL (ref 30.00–100.00)

## 2019-09-23 LAB — HEMOGLOBIN A1C: Hgb A1c MFr Bld: 6 % (ref 4.6–6.5)

## 2019-09-23 MED ORDER — CYCLOBENZAPRINE HCL 10 MG PO TABS: 10.0000 mg | ORAL_TABLET | Freq: Two times a day (BID) | ORAL | 0 refills | Status: AC | PRN

## 2019-09-23 NOTE — Progress Notes (Signed)
   Subjective:    Patient ID: Kristy Cook, female    DOB: 1966/03/17, 54 y.o.   MRN: [id number]  HPI Chief Complaint  Patient presents with  . Neck Pain    x3 days, no injury - painful to lean head back and to left and right, and when opening jaw wide - taking advil with no relief -    This is a 54 yo female who presents today with acute onset neck pain. Started 4 days ago when she awoke. No radiation, pain at base of neck bilaterally. Took ibuprofen, Alleve, Theraflu with some relief. Pain with lying down. Pain is the same over last several days. Pain worse with flexion, rotation No visual changes, headache, numbness, tingling, weakness.   Has upcoming gyn appointment.    Review of Systems Per HPI    Objective:   Physical Exam Vitals reviewed.  Constitutional:      General: She is not in acute distress.    Appearance: Normal appearance. She is obese. She is not ill-appearing, toxic-appearing or diaphoretic.  HENT:     Head: Normocephalic and atraumatic.  Eyes:     Conjunctiva/sclera: Conjunctivae normal.  Cardiovascular:     Rate and Rhythm: Normal rate.  Pulmonary:     Effort: Pulmonary effort is normal.  Musculoskeletal:     Cervical back: Tenderness (upper cervical) present. No swelling, edema, deformity, erythema, spasms or bony tenderness. Pain with movement present. Decreased range of motion.     Thoracic back: Normal.     Lumbar back: Normal.     Right lower leg: No edema.     Left lower leg: No edema.  Skin:    General: Skin is warm and dry.  Neurological:     Mental Status: She is alert and oriented to person, place, and time.       BP 132/70 (BP Location: Left Arm, Patient Position: Sitting, Cuff Size: Normal)   Pulse 88   Temp 98.5 F (36.9 C) (Temporal)   Ht 5' (1.524 m)   Wt 182 lb (82.6 kg)   SpO2 99%   BMI 35.54 kg/m  Wt Readings from Last 3 Encounters:  09/23/19 182 lb (82.6 kg)  06/04/19 183 lb (83 kg)  10/01/18 183 lb (83 kg)         Assessment & Plan:  1. Pre-diabetes - Hemoglobin A1c - Lipid Panel  2. Benign essential HTN - Basic Metabolic Panel  3. Vitamin D deficiency - Vitamin D, 25-hydroxy  4. Strain of neck muscle, initial encounter -  Patient Instructions  You have a neck strain  I have sent in a muscle relaxer- it will make you sleepy  Do gentle range of motion, heat 3-4 times a day  Can take Alleve 2 tabs every 12 hours for 3 days  If any worsening symptoms or not better in 5-7 days, let me know   - cyclobenzaprine (FLEXERIL) 10 MG tablet; Take 1 tablet (10 mg total) by mouth 2 (two) times daily as needed for muscle spasms. May make you sleepy, do not drive or operate heavy machinery.  Dispense: 30 tablet; Refill: 0   [name], FNP-BC  [facility]  09/23/2019 10:57 AM

## 2019-09-23 NOTE — Patient Instructions (Signed)
You have a neck strain  I have sent in a muscle relaxer- it will make you sleepy  Do gentle range of motion, heat 3-4 times a day  Can take Alleve 2 tabs every 12 hours for 3 days  If any worsening symptoms or not better in 5-7 days, let me know

## 2019-09-24 ENCOUNTER — Ambulatory Visit: Payer: 59 | Admitting: Family Medicine

## 2019-09-25 ENCOUNTER — Encounter: Payer: Self-pay | Admitting: Family Medicine

## 2019-09-28 NOTE — Telephone Encounter (Signed)
Called patient to discuss. No answer. Left message that I called and will try her again later.

## 2019-09-28 NOTE — Telephone Encounter (Signed)
Called patient. No answer. Left message that I would expect her to feel better by now and that if her neck is still bothering her, she needs follow up. Also, advised her that I am unable to just send in pain medication without further evaluation. Advised her to schedule follow up.

## 2019-11-01 ENCOUNTER — Telehealth: Payer: 59 | Admitting: Family

## 2019-11-01 DIAGNOSIS — J029 Acute pharyngitis, unspecified: Secondary | ICD-10-CM

## 2019-11-01 MED ORDER — AMOXICILLIN 500 MG PO CAPS: 500.0000 mg | ORAL_CAPSULE | Freq: Two times a day (BID) | ORAL | 0 refills | Status: AC

## 2019-11-01 NOTE — Progress Notes (Signed)

## 2019-11-28 ENCOUNTER — Encounter: Payer: Self-pay | Admitting: Family Medicine

## 2019-12-01 ENCOUNTER — Other Ambulatory Visit: Payer: Self-pay | Admitting: Family Medicine

## 2019-12-02 NOTE — Telephone Encounter (Signed)
Pt last seen 09/23/19 We have not filled this medication for her before  Please advise, thanks.

## 2019-12-02 NOTE — Telephone Encounter (Signed)
[place] Night - Client Nonclinical Telephone Record  [facility] Client [facility] Night - Client Client Site [place] Physician Tor Netters- NP Contact Type Call Who Is Calling Patient / Member / Family / Caregiver Caller Name George West Phone Number [phone] Patient Name Kristy Cook Patient DOB 08-31-1965 Call Type Message Only Information Provided Reason for Call Medication Question / Request Initial Comment caller states she sent message through my chart about a script refill and also the pharmacy reached out also but no response as of yet caller needing ( Valtrex) Additional Comment Disp. Time Disposition Final User 12/02/2019 7:53:59 AM General Information Provided Yes Kenton Kingfisher, Lanette Call Closed By: Nelia Shi Transaction Date/Time: 12/02/2019 7:51:11 AM (ET)  Contacted pt to see where she wanted script sent. She would like it sent to [place], [employer], [place].

## 2019-12-03 ENCOUNTER — Other Ambulatory Visit: Payer: Self-pay | Admitting: Family Medicine

## 2019-12-03 DIAGNOSIS — B009 Herpesviral infection, unspecified: Secondary | ICD-10-CM

## 2019-12-03 MED ORDER — VALACYCLOVIR HCL 500 MG PO TABS: 500.0000 mg | ORAL_TABLET | Freq: Every day | ORAL | 3 refills | Status: AC

## 2022-11-22 ENCOUNTER — Ambulatory Visit: Payer: 59 | Admitting: Dermatology

## 2023-11-05 DIAGNOSIS — K8020 Calculus of gallbladder without cholecystitis without obstruction: Secondary | ICD-10-CM | POA: Insufficient documentation

## 2023-12-11 ENCOUNTER — Ambulatory Visit: Admitting: Podiatry

## 2023-12-11 DIAGNOSIS — M2041 Other hammer toe(s) (acquired), right foot: Secondary | ICD-10-CM | POA: Diagnosis not present

## 2023-12-11 DIAGNOSIS — M2042 Other hammer toe(s) (acquired), left foot: Secondary | ICD-10-CM | POA: Diagnosis not present

## 2023-12-11 MED ORDER — LIDOCAINE 5 % EX OINT
1.0000 | TOPICAL_OINTMENT | CUTANEOUS | 0 refills | Status: DC | PRN
Start: 2023-12-11 — End: 2023-12-20

## 2023-12-11 NOTE — Progress Notes (Unsigned)
 Bilateral fifth digit hammertoe shoe gear modification lidocaine for pain

## 2023-12-18 ENCOUNTER — Encounter: Payer: Self-pay | Admitting: Podiatry

## 2023-12-20 ENCOUNTER — Other Ambulatory Visit: Payer: Self-pay

## 2023-12-20 MED ORDER — LIDOCAINE 5 % EX OINT: 1.0000 | TOPICAL_OINTMENT | Freq: Three times a day (TID) | CUTANEOUS | 2 refills | Status: AC | PRN

## 2023-12-23 ENCOUNTER — Telehealth: Payer: Self-pay

## 2023-12-23 NOTE — Telephone Encounter (Signed)
 PA request received from [employer]eds for lidocaine  5% ointment. PA submitted and waiting on response.  Hennie Gosling  (KeyBETHA RABON) PA Case ID #: [id number] Rx #: [id number]

## 2023-12-23 NOTE — Telephone Encounter (Signed)
 PA was denied
